# Patient Record
Sex: Male | Born: 1975
Health system: Southern US, Community
[De-identification: ages and names within clinical notes are randomized; demographics above are authoritative.]

## PROBLEM LIST (undated history)

## (undated) DIAGNOSIS — Z87442 Personal history of urinary calculi: Secondary | ICD-10-CM

## (undated) DIAGNOSIS — K603 Anal fistula, unspecified: Secondary | ICD-10-CM

## (undated) DIAGNOSIS — N200 Calculus of kidney: Secondary | ICD-10-CM

## (undated) HISTORY — PX: WISDOM TOOTH EXTRACTION: SHX21

## (undated) HISTORY — DX: Calculus of kidney: N20.0

---

## 2014-09-07 ENCOUNTER — Ambulatory Visit (HOSPITAL_COMMUNITY)
Admission: RE | Admit: 2014-09-07 | Discharge: 2014-09-07 | Disposition: A | Discharge status: Home / Self Care | Payer: 59 | Source: Ambulatory Visit | Attending: Family Medicine | Admitting: Family Medicine

## 2014-09-07 ENCOUNTER — Other Ambulatory Visit (HOSPITAL_COMMUNITY): Payer: Self-pay | Admitting: Family Medicine

## 2014-09-07 DIAGNOSIS — M79601 Pain in right arm: Secondary | ICD-10-CM

## 2014-09-07 NOTE — Progress Notes (Signed)
Right upper extremity venous duplex completed:  No evidence of DVT or superficial thrombosis.    

## 2014-09-28 ENCOUNTER — Other Ambulatory Visit: Payer: Self-pay | Admitting: Orthopedic Surgery

## 2014-09-28 DIAGNOSIS — R591 Generalized enlarged lymph nodes: Secondary | ICD-10-CM

## 2014-10-02 ENCOUNTER — Ambulatory Visit
Admission: RE | Admit: 2014-10-02 | Discharge: 2014-10-02 | Disposition: A | Discharge status: Home / Self Care | Payer: 59 | Source: Ambulatory Visit | Attending: Orthopedic Surgery | Admitting: Orthopedic Surgery

## 2014-10-02 DIAGNOSIS — R591 Generalized enlarged lymph nodes: Secondary | ICD-10-CM

## 2014-11-01 ENCOUNTER — Other Ambulatory Visit (INDEPENDENT_AMBULATORY_CARE_PROVIDER_SITE_OTHER): Payer: 59

## 2014-11-01 ENCOUNTER — Encounter: Payer: Self-pay | Admitting: Family Medicine

## 2014-11-01 ENCOUNTER — Ambulatory Visit (INDEPENDENT_AMBULATORY_CARE_PROVIDER_SITE_OTHER): Payer: 59 | Admitting: Family Medicine

## 2014-11-01 VITALS — BP 120/72 | HR 79 | Ht 67.0 in | Wt 165.0 lb

## 2014-11-01 DIAGNOSIS — M79601 Pain in right arm: Secondary | ICD-10-CM

## 2014-11-01 DIAGNOSIS — M25521 Pain in right elbow: Secondary | ICD-10-CM

## 2014-11-01 NOTE — Progress Notes (Signed)
Jeremiah ScaleZach Baker D.O. [facility] [address] [place], [place] [address] Phone: [phone] Subjective:     CC: Right shoulder pain.   EXB:[id number]:Subjective Jeremiah Baker is a 39 y.o. male coming in with complaint of right arm pain patient states he was lifting on November 2 toe pain immediately on the distal portion of his humerus on the right side medial aspect. Patient states that he this was followed by of redness streaking. Patient 2 days later went to the primary care provider and there was a concern for an upper extremity DVT. Ultrasound was negative. 3 days after this the pain did not resolve and patient went to orthopedics. Patient did have x-ray that did not show any significant findings. Patient was having difficulty actually extending his arm so a CT scan was evaluated. This was reviewed by me. The CT scan did not steroid anything significantly remarkable. Patient does not have a significant amount of the tissue which made identification of certain anatomy difficult. There was no gross deformity. No longer having pain.  Has had 2 episodes of his skin turning red again. Full range of motion.       Past medical history, social, surgical and family history all reviewed in electronic medical record.   Review of Systems: No headache, visual changes, nausea, vomiting, diarrhea, constipation, dizziness, abdominal pain, skin rash, fevers, chills, night sweats, weight loss, swollen lymph nodes, body aches, joint swelling, muscle aches, chest pain, shortness of breath, mood changes.   Objective Blood pressure 120/72, pulse 79, height 5\' 7"  (1.702 m), weight 165 lb (74.844 kg), SpO2 98 %.  General: No apparent distress alert and oriented x3 mood and affect normal, dressed appropriately.  HEENT: Pupils equal, extraocular movements intact  Respiratory: Patient's speak in full sentences and does not appear short of breath  Cardiovascular: No lower extremity edema, non tender,  no erythema  Skin: Warm dry intact with no signs of infection or rash on extremities or on axial skeleton.  Abdomen: Soft nontender  Neuro: Cranial nerves II through XII are intact, neurovascularly intact in all extremities with 2+ DTRs and 2+ pulses.  Lymph: No lymphadenopathy of posterior or anterior cervical chain or axillae bilaterally.  Gait normal with good balance and coordination.  MSK:  Non tender with full range of motion and good stability and symmetric strength and tone of shoulders,  wrist, hip, knee and ankles bilaterally.  Elbow: right  Unremarkable to inspection. Range of motion full pronation, supination, flexion, extension. Strength is full to all of the above directions Stable to varus, valgus stress. Negative moving valgus stress test. No discrete areas of tenderness to palpation. Ulnar nerve does not sublux. Negative cubital tunnel Tinel's. When comparing to the contralateral elbow patient does have a half centimeter increase in diameter. Distal pulses palpated and 2+ compared to the contralateral side. Contralateral elbow unremarkable  Musculoskeletal ultrasound was performed and interpreted by Terrilee FilesZach Baker D.O.   Elbow: Right Lateral epicondyle and common extensor tendon origin visualized.  No edema, effusions, or avulsions seen.  Radial head unremarkable and located in annular ligament Medial epicondyle and common flexor tendon origin visualized.  No edema, effusions, or avulsions seen. Ulnar nerve in cubital tunnel unremarkable. Patient though does have what appears to be a very small part of scar tissue holding down the brachial artery near the medial epicondylar region. Mild hypoechoic changes noted. This is fully compressible with no signs of venous thrombosis. Olecranon and triceps insertion visualized and unremarkable without edema,  effusion, or avulsion.  No signs olecranon bursitis. Power doppler signal normal.  IMPRESSION:  Unremarkable with chronic scarring  surrounding the brachial neurovascular plexus near the medial epicondylar region.      Impression and Recommendations:     This case required medical decision making of moderate complexity.

## 2014-11-01 NOTE — Assessment & Plan Note (Signed)
Patient's right elbow pain seems to be fairly non-specific. Patient is having no pain at this time and has full range of motion. Encourage patient to start doing exercises again. Differential includes the patient may have had actually a deep venous thrombosis of the upper extremity but with him not having any risk factors and no findings at this time I do not feel that treatment is warranted. Discuss that if he gets swelling of the redness again that I would like to see him immediately. Discussed icing and compression sleeve with activity to try to avoid if this is some type of neovascular subluxation that could've occurred with heavy lifting. Patient's biceps tendon as well as Brickell radialis tendon does not show any significant findings that there is no fracture that is noted. Patient will try this conservative therapy and come back in 3 weeks for further evaluation.

## 2014-11-01 NOTE — Patient Instructions (Signed)
Very nice to meet you Get a compression sleeve for the arm.  Dicks or omega sports. Wear it with exercises and 30 minutes afterward.  Ice 20 minutes 2 times daily. Usually after activity and before bed. Start with weight 25% of what you were doing and increase 5-10% a week.   Try the topical medicine up to 2 times daily for pain.  Come back in 3 weeks to make sure you are doing fine.

## 2014-11-26 ENCOUNTER — Ambulatory Visit: Payer: 59 | Admitting: Family Medicine

## 2015-03-14 ENCOUNTER — Encounter: Payer: Self-pay | Admitting: Internal Medicine

## 2015-05-17 ENCOUNTER — Other Ambulatory Visit (INDEPENDENT_AMBULATORY_CARE_PROVIDER_SITE_OTHER): Payer: 59

## 2015-05-17 ENCOUNTER — Ambulatory Visit: Payer: 59 | Admitting: Internal Medicine

## 2015-05-17 ENCOUNTER — Ambulatory Visit (INDEPENDENT_AMBULATORY_CARE_PROVIDER_SITE_OTHER): Payer: 59 | Admitting: Physician Assistant

## 2015-05-17 ENCOUNTER — Encounter: Payer: Self-pay | Admitting: Physician Assistant

## 2015-05-17 VITALS — BP 120/60 | HR 68 | Ht 66.5 in | Wt 160.0 lb

## 2015-05-17 DIAGNOSIS — R1011 Right upper quadrant pain: Secondary | ICD-10-CM

## 2015-05-17 DIAGNOSIS — R1013 Epigastric pain: Secondary | ICD-10-CM | POA: Diagnosis not present

## 2015-05-17 DIAGNOSIS — R110 Nausea: Secondary | ICD-10-CM

## 2015-05-17 LAB — CBC WITH DIFFERENTIAL/PLATELET
Basophils Absolute: 0 10*3/uL (ref 0.0–0.1)
Basophils Relative: 0.3 % (ref 0.0–3.0)
EOS ABS: 0 10*3/uL (ref 0.0–0.7)
Eosinophils Relative: 0.9 % (ref 0.0–5.0)
HCT: 43.6 % (ref 39.0–52.0)
Hemoglobin: 14.4 g/dL (ref 13.0–17.0)
LYMPHS PCT: 38.5 % (ref 12.0–46.0)
Lymphs Abs: 2.1 10*3/uL (ref 0.7–4.0)
MCHC: 33.2 g/dL (ref 30.0–36.0)
MCV: 80.3 fl (ref 78.0–100.0)
Monocytes Absolute: 0.4 10*3/uL (ref 0.1–1.0)
Monocytes Relative: 7.6 % (ref 3.0–12.0)
Neutro Abs: 2.8 10*3/uL (ref 1.4–7.7)
Neutrophils Relative %: 52.7 % (ref 43.0–77.0)
Platelets: 188 10*3/uL (ref 150.0–400.0)
RBC: 5.43 Mil/uL (ref 4.22–5.81)
RDW: 12.9 % (ref 11.5–15.5)
WBC: 5.4 10*3/uL (ref 4.0–10.5)

## 2015-05-17 LAB — HEPATIC FUNCTION PANEL
ALBUMIN: 4.5 g/dL (ref 3.5–5.2)
ALK PHOS: 72 U/L (ref 39–117)
ALT: 31 U/L (ref 0–53)
AST: 21 U/L (ref 0–37)
BILIRUBIN DIRECT: 0.1 mg/dL (ref 0.0–0.3)
TOTAL PROTEIN: 7.3 g/dL (ref 6.0–8.3)
Total Bilirubin: 0.5 mg/dL (ref 0.2–1.2)

## 2015-05-17 LAB — AMMONIA: Ammonia: 28 umol/L (ref 11–35)

## 2015-05-17 LAB — LIPASE: LIPASE: 15 U/L (ref 11.0–59.0)

## 2015-05-17 MED ORDER — PANTOPRAZOLE SODIUM 40 MG PO TBEC: DELAYED_RELEASE_TABLET | ORAL | Status: DC

## 2015-05-17 NOTE — Progress Notes (Signed)
Patient ID: Jeremiah Baker, male   DOB: 1976-08-04, 39 y.o.   MRN: [id number]    HPI:  Jeremiah Baker is a 39 y.o.   male  referred due to complaints of epigastric pain and nausea. Patient is a delightful 39 year old male who states that on Christmas Eve he woke up feeling poorly. He had a sore throat and drank a lot of tea and several days later developed epigastric pain with vomiting. He tried using pepsin tablets for 2 weeks and try to probiotic but had no relief. Since that time, for the past 7 months he frequently feels nauseous and bloated. He is nauseous all the time and it is not affected by food. He has no regurgitation. Its that he gets a bandlike tightness in the epigastric area and right upper quadrant with bloating and nausea. He has no early satiety. His appetite is good. He has not been on any new medications. He denies use of non-steroidal anti-inflammatory drugs. He denies use of alcohol or tobacco. He denies use of spicy foods. He has lost 10 pounds since January but he thinks this may be due to cutting back on carpets. His bowel movements are normal. He's had no bright red blood per rectum or melena. He has an aunt who is a Solicitor in [place]. He has spoken to her on several occasions regarding his discomfort and was advised to seek evaluation. He is concerned about possible gallbladder disease or ulcers.   Past Medical History  Diagnosis Date  . Kidney stones     Past Surgical History  Procedure Laterality Date  . Wisdom tooth extraction     Family History  Problem Relation Age of Onset  . Non-Hodgkin's lymphoma Father   . Hypertension Mother   . Colon polyps Mother    History  Substance Use Topics  . Smoking status: Never Smoker   . Smokeless tobacco: Not on file  . Alcohol Use: No   Current Outpatient Prescriptions  Medication Sig Dispense Refill  . pantoprazole (PROTONIX) 40 MG tablet Take 1 every morning 30 minutes  prior to breakfast. 30 tablet 2   No current facility-administered medications for this visit.   Allergies  Allergen Reactions  . Shellfish Allergy Hives    Reaction to shrimp     Review of Systems: Gen: Denies any fever, chills, sweats, anorexia, fatigue, weakness, malaise, weight loss, and sleep disorder CV: Denies chest pain, angina, palpitations, syncope, orthopnea, PND, peripheral edema, and claudication. Resp: Denies dyspnea at rest, dyspnea with exercise, cough, sputum, wheezing, coughing up blood, and pleurisy. GI: Denies vomiting blood, jaundice, and fecal incontinence.   Denies dysphagia or odynophagia. GU : Denies urinary burning, blood in urine, urinary frequency, urinary hesitancy, nocturnal urination, and urinary incontinence. MS: Denies joint pain, limitation of movement, and swelling, stiffness, low back pain, extremity pain. Denies muscle weakness, cramps, atrophy.  Derm: Denies rash, itching, dry skin, hives, moles, warts, or unhealing ulcers.  Psych: Denies depression, anxiety, memory loss, suicidal ideation, hallucinations, paranoia, and confusion. Heme: Denies bruising, bleeding, and enlarged lymph nodes. Neuro:  Denies any headaches, dizziness, paresthesias. Endo:  Denies any problems with DM, thyroid, adrenal function    Physical Exam: BP 120/60 mmHg  Pulse 68  Ht 5' 6.5" (1.689 m)  Wt 160 lb (72.576 kg)  BMI 25.44 kg/m2 Constitutional: Pleasant,well-developed male in no acute distress. HEENT: Normocephalic and atraumatic. Conjunctivae are normal. No scleral icterus. Neck supple. No thyromegaly Cardiovascular: Normal rate, regular rhythm.  Pulmonary/chest: Effort  normal and breath sounds normal. No wheezing, rales or rhonchi. Abdominal: Soft, nondistended, tender epigastric area and right upper quadrant with no rebound or guarding, Bowel sounds active throughout. There are no masses palpable. No hepatomegaly. Extremities: no edema Lymphadenopathy: No  cervical adenopathy noted. Neurological: Alert and oriented to person place and time. Skin: Skin is warm and dry. No rashes noted. Psychiatric: Normal mood and affect. Behavior is normal.  ASSESSMENT AND PLAN: 39 year old male with a 7 month history of nausea associated with postprandial epigastric and right upper discomfort and bloating self-referred for evaluation. Symptoms may be due to GERD and an antireflux regimen has been reviewed. He will be given a trial of pantoprazole 40 mg 1 by mouth 30 minutes prior to breakfast. A CBC, amylase, lipase, and hepatic function panel will be obtained along with an H. pylori stool antigen. He will be scheduled for an abdominal ultrasound to evaluate for possible choledocholithiasis. He will also be scheduled for an EGD to assess for esophagitis, gastritis, ulcer etc.The risks, benefits, and alternatives to endoscopy with possible biopsy and possible dilation were discussed with the patient and they consent to proceed. The procedure will be scheduled with Dr. Marina Goodell. Further recommendations will be made pending the findings of the above.    Konstantina Nachreiner, Tollie Pizza PA-C 05/17/2015, 4:34 PM

## 2015-05-17 NOTE — Patient Instructions (Addendum)
You have been scheduled for an endoscopy. Please follow written instructions given to you at your visit today. If you use inhalers (even only as needed), please bring them with you on the day of your procedure.  Your physician has requested that you go to the basement for  lab work before leaving today   We have sent medications to your pharmacy for you to pick up at your convenience.  You have been scheduled for an abdominal ultrasound at [facility] (1st floor of hospital) on 05/24/2015 at 10:00am. Please arrive 15 minutes prior to your appointment for registration. Make certain not to have anything to eat or drink after midnight the night before the Ultrasound. Should you need to reschedule your appointment, please contact radiology at [phone]. This test typically takes about 30 minutes to perform.

## 2015-05-18 NOTE — Progress Notes (Signed)
Reviewed. Agree with assessment and plans 

## 2015-05-20 ENCOUNTER — Other Ambulatory Visit: Payer: 59

## 2015-05-20 DIAGNOSIS — R1013 Epigastric pain: Secondary | ICD-10-CM

## 2015-05-20 DIAGNOSIS — R1011 Right upper quadrant pain: Secondary | ICD-10-CM

## 2015-05-20 DIAGNOSIS — R110 Nausea: Secondary | ICD-10-CM

## 2015-05-22 LAB — H. PYLORI ANTIGEN, STOOL: H PYLORI AG STL: NEGATIVE

## 2015-05-23 ENCOUNTER — Other Ambulatory Visit: Payer: Self-pay

## 2015-05-23 DIAGNOSIS — R110 Nausea: Secondary | ICD-10-CM

## 2015-05-23 DIAGNOSIS — R1013 Epigastric pain: Secondary | ICD-10-CM

## 2015-05-23 DIAGNOSIS — R1011 Right upper quadrant pain: Secondary | ICD-10-CM

## 2015-05-24 ENCOUNTER — Ambulatory Visit (HOSPITAL_COMMUNITY)
Admission: RE | Admit: 2015-05-24 | Discharge: 2015-05-24 | Disposition: A | Discharge status: Home / Self Care | Payer: 59 | Source: Ambulatory Visit | Attending: Physician Assistant | Admitting: Physician Assistant

## 2015-05-24 DIAGNOSIS — R1013 Epigastric pain: Secondary | ICD-10-CM

## 2015-05-24 DIAGNOSIS — R1011 Right upper quadrant pain: Secondary | ICD-10-CM | POA: Insufficient documentation

## 2015-05-24 DIAGNOSIS — R110 Nausea: Secondary | ICD-10-CM | POA: Diagnosis not present

## 2015-05-31 ENCOUNTER — Encounter: Payer: Self-pay | Admitting: Internal Medicine

## 2015-05-31 ENCOUNTER — Ambulatory Visit (AMBULATORY_SURGERY_CENTER): Payer: 59 | Admitting: Internal Medicine

## 2015-05-31 VITALS — BP 124/77 | HR 72 | Temp 97.5°F | Resp 25 | Ht 66.5 in | Wt 160.0 lb

## 2015-05-31 DIAGNOSIS — R1011 Right upper quadrant pain: Secondary | ICD-10-CM | POA: Diagnosis not present

## 2015-05-31 MED ORDER — SODIUM CHLORIDE 0.9 % IV SOLN: 500.0000 mL | INTRAVENOUS | Status: DC

## 2015-05-31 NOTE — Op Note (Signed)
[facility] [address] [place], [address]   ENDOSCOPY PROCEDURE REPORT  PATIENT: Jeremiah Baker, Jeremiah Baker  MR#: [id number] BIRTHDATE: Nov 05, 1975 , 39  yrs. old GENDER: male ENDOSCOPIST: Roxy Cedar, MD REFERRED BY:  Darrow Bussing, M.D. PROCEDURE DATE:  05/31/2015 PROCEDURE:  EGD, diagnostic ASA CLASS:     Class I INDICATIONS:  abdominal pain in the upper right quadrant. MEDICATIONS: Monitored anesthesia care and Propofol 200 mg IV TOPICAL ANESTHETIC: none  DESCRIPTION OF PROCEDURE: After the risks benefits and alternatives of the procedure were thoroughly explained, informed consent was obtained.  The LB [id number] endoscope was introduced through the mouth and advanced to the second portion of the duodenum , Without limitations.  The instrument was slowly withdrawn as the mucosa was fully examined.   EXAM: The esophagus and gastroesophageal junction were completely normal in appearance.  The stomach was entered and closely examined.The antrum, angularis, and lesser curvature were well visualized, including a retroflexed view of the cardia and fundus. The stomach wall was normally distensable.  The scope passed easily through the pylorus into the duodenum.  Retroflexed views revealed no abnormalities.     The scope was then withdrawn from the patient and the procedure completed.  COMPLICATIONS: There were no immediate complications.  ENDOSCOPIC IMPRESSION: 1. Normal upper endoscopy 2. Upper abdominal discomfort may be due to gallbladder disease (sludge/?small stones on ultrasound)  RECOMMENDATIONS: 1.  Discontinue pantoprazole 2.  General surgical opinion regarding abdominal complaints and ultrasound findings. Question possible laparoscopic cholecystectomy. My office will arrange surgical consultation.  REPEAT EXAM:  eSigned:  Roxy Cedar, MD 05/31/2015 3:17 PM    CC:The Patient  ; Darrow Bussing, MD

## 2015-05-31 NOTE — Patient Instructions (Addendum)
YOU HAD AN ENDOSCOPIC PROCEDURE TODAY AT THE [facility]:   Refer to the procedure report that was given to you for any specific questions about what was found during the examination.  If the procedure report does not answer your questions, please call your gastroenterologist to clarify.  If you requested that your care partner not be given the details of your procedure findings, then the procedure report has been included in a sealed envelope for you to review at your convenience later.  YOU SHOULD EXPECT: Some feelings of bloating in the abdomen. Passage of more gas than usual.  Walking can help get rid of the air that was put into your GI tract during the procedure and reduce the bloating. If you had a lower endoscopy (such as a colonoscopy or flexible sigmoidoscopy) you may notice spotting of blood in your stool or on the toilet paper. If you underwent a bowel prep for your procedure, you may not have a normal bowel movement for a few days.  Please Note:  You might notice some irritation and congestion in your nose or some drainage.  This is from the oxygen used during your procedure.  There is no need for concern and it should clear up in a day or so.  SYMPTOMS TO REPORT IMMEDIATELY:   Following lower endoscopy (colonoscopy or flexible sigmoidoscopy):  Excessive amounts of blood in the stool  Significant tenderness or worsening of abdominal pains  Swelling of the abdomen that is new, acute  Fever of 100F or higher    For urgent or emergent issues, a gastroenterologist can be reached at any hour by calling [phone].   DIET: Your first meal following the procedure should be a small meal and then it is ok to progress to your normal diet. Heavy or fried foods are harder to digest and may make you feel nauseous or bloated.  Likewise, meals heavy in dairy and vegetables can increase bloating.  Drink plenty of fluids but you should avoid alcoholic beverages for 24  hours.  ACTIVITY:  You should plan to take it easy for the rest of today and you should NOT DRIVE or use heavy machinery until tomorrow (because of the sedation medicines used during the test).    FOLLOW UP: Our staff will call the number listed on your records the next business day following your procedure to check on you and address any questions or concerns that you may have regarding the information given to you following your procedure. If we do not reach you, we will leave a message.  However, if you are feeling well and you are not experiencing any problems, there is no need to return our call.  We will assume that you have returned to your regular daily activities without incident.  If any biopsies were taken you will be contacted by phone or by letter within the next 1-3 weeks.  Please call us at [phone] if you have not heard about the biopsies in 3 weeks.    SIGNATURES/CONFIDENTIALITY: You and/or your care partner have signed paperwork which will be entered into your electronic medical record.  These signatures attest to the fact that that the information above on your After Visit Summary has been reviewed and is understood.  Full responsibility of the confidentiality of this discharge information lies with you and/or your care-partner.  Discontinue panprotazole. Dr. Lamar Sprinkles office will arrange for a surgical consult WG:[id number] surgery.YOU HAD AN ENDOSCOPIC PROCEDURE TODAY AT THE [facility]:  Refer to the procedure report that was given to you for any specific questions about what was found during the examination.  If the procedure report does not answer your questions, please call your gastroenterologist to clarify.  If you requested that your care partner not be given the details of your procedure findings, then the procedure report has been included in a sealed envelope for you to review at your convenience later.  YOU SHOULD EXPECT: Some feelings of bloating in  the abdomen. Passage of more gas than usual.  Walking can help get rid of the air that was put into your GI tract during the procedure and reduce the bloating. If you had a lower endoscopy (such as a colonoscopy or flexible sigmoidoscopy) you may notice spotting of blood in your stool or on the toilet paper. If you underwent a bowel prep for your procedure, you may not have a normal bowel movement for a few days.  Please Note:  You might notice some irritation and congestion in your nose or some drainage.  This is from the oxygen used during your procedure.  There is no need for concern and it should clear up in a day or so.  SYMPTOMS TO REPORT IMMEDIATELY:     Following upper endoscopy (EGD)  Vomiting of blood or coffee ground material  New chest pain or pain under the shoulder blades  Painful or persistently difficult swallowing  New shortness of breath  Fever of 100F or higher  Black, tarry-looking stools  For urgent or emergent issues, a gastroenterologist can be reached at any hour by calling [phone].   DIET: Your first meal following the procedure should be a small meal and then it is ok to progress to your normal diet. Heavy or fried foods are harder to digest and may make you feel nauseous or bloated.  Likewise, meals heavy in dairy and vegetables can increase bloating.  Drink plenty of fluids but you should avoid alcoholic beverages for 24 hours.  ACTIVITY:  You should plan to take it easy for the rest of today and you should NOT DRIVE or use heavy machinery until tomorrow (because of the sedation medicines used during the test).    FOLLOW UP: Our staff will call the number listed on your records the next business day following your procedure to check on you and address any questions or concerns that you may have regarding the information given to you following your procedure. If we do not reach you, we will leave a message.  However, if you are feeling well and you are not  experiencing any problems, there is no need to return our call.  We will assume that you have returned to your regular daily activities without incident.  If any biopsies were taken you will be contacted by phone or by letter within the next 1-3 weeks.  Please call us at [phone] if you have not heard about the biopsies in 3 weeks.    SIGNATURES/CONFIDENTIALITY: You and/or your care partner have signed paperwork which will be entered into your electronic medical record.  These signatures attest to the fact that that the information above on your After Visit Summary has been reviewed and is understood.  Full responsibility of the confidentiality of this discharge information lies with you and/or your care-partner.

## 2015-05-31 NOTE — Progress Notes (Signed)
To recovery, report to Scott, RN, VSS 

## 2015-06-03 ENCOUNTER — Telehealth: Payer: Self-pay

## 2015-06-03 NOTE — Telephone Encounter (Signed)
Pt scheduled to see Dr. Michaell Cowing for possible gallbladder surgery with [facility] 06/19/15@8 :45am, pt to arrive there at 8:15am. Pt aware of appt.

## 2015-06-03 NOTE — Telephone Encounter (Signed)
  Follow up Call-  Call back number 05/31/2015  Post procedure Call Back phone  # [phone] (M)  Permission to leave phone message Yes     Patient questions:  Do you have a fever, pain , or abdominal swelling? No. Pain Score  0 *  Have you tolerated food without any problems? Yes.    Have you been able to return to your normal activities? Yes.    Do you have any questions about your discharge instructions: Diet   No. Medications  No. Follow up visit  No.  Do you have questions or concerns about your Care? No.  Actions: * If pain score is 4 or above: No action needed, pain <4.

## 2015-06-19 ENCOUNTER — Other Ambulatory Visit: Payer: Self-pay | Admitting: Surgery

## 2015-06-19 NOTE — H&P (Signed)
Jeremiah Baker 06/19/2015 8:36 AM Location: [facility] Patient #: [id number] DOB: Aug 21, 1976 Married / Language: [name] / Race: White Male History of Present Illness Ardeth Sportsman MD; 06/19/2015 1:41 PM) Patient words: gallbladder.  The patient is a 39 year old male who presents for evaluation of gall stones. Patient sent for surgical consultation by his gastroenterologist Dr. Yancey Flemings with [facility]. Concern for upper abdominal pain and gallbladder sludge/stones. Young active male. History of intermittent epigastric pain starting last Christmas Eve. Bloating, nausea/vomiting after eating Tiramisu. Again on New Years Eve. Both episodes happened after eating a rich dessert and after having some salmon. Remembers getting amoxillin for sore throat - no more GI Sx for a few weeks. However, intermittent recurring episodes of nausea and bloating. Usually worse in the RIGHT upper quadrant. Discussed with his Aunt whom is a gastroenterologist. Saw his PCP. Concern perhaps it was related to reflux but no improvement on proton pump inhibitors. Saw GI, and upper endoscopy earlier this month appeared normal. Given ultrasound showed evidence of gallstones/sludge, surgical consultation requested to see if gallbladder as possible etiology and that this is biliary colic. Eating less, losing weight (15lbs over past year). He works out at [employer] 4 times a week. Used to take moderate volume of nutritional supplements. When diagnosed with gallstones backed off to just mild intermittent protein supplement. He claims he can eat large meals without much difficulty. Usually has about one every day. He gets attacks about twice a week now. Not particularly more intense. No increased belching or reflux. No problems with reflux or heartburn while supine or bending over. No sick contacts. No travel history. Originally much of his family is from [place] but no recent travels. He had been  avoiding coffee and tomato and acid foods without much improvement. He rarely drinks alcohol. Only intermittent use of nonsteroidals. Certainly not on a weekly let alone daily. No history of hepatitis or pancreatitis. No history of jaundice. Does not smoke. No prior abdominal surgery. No history of Crohn's. No ulcerative colitis. No irritable bowel syndrome. No inflammatory bowel disease. Other Problems Fay Records, CMA; 06/19/2015 8:36 AM) Kidney Larina Bras  Past Surgical History Fay Records, [place]; 06/19/2015 8:36 AM) No pertinent past surgical history  Diagnostic Studies History Fay Records, [place]; 06/19/2015 8:36 AM) Colonoscopy never  Allergies Fay Records, CMA; 06/19/2015 8:36 AM) Shellfish Hives.  Medication History Fay Records, CMA; 06/19/2015 8:37 AM) Pantoprazole Sodium (40MG  Tablet DR, Oral daily) Active. Medications Reconciled  Social History Fay Records, [place]; 06/19/2015 8:36 AM) Caffeine use Tea. No alcohol use No drug use Tobacco use Never smoker.  Family History Fay Records, [place]; 06/19/2015 8:36 AM) Cancer Father. Hypertension Mother.     Review of Systems Fay Records CMA; 06/19/2015 8:36 AM) General Present- Appetite Loss and Weight Loss. Not Present- Chills, Fatigue, Fever, Night Sweats and Weight Gain. Skin Not Present- Change in Wart/Mole, Dryness, Hives, Jaundice, New Lesions, Non-Healing Wounds, Rash and Ulcer. HEENT Present- Seasonal Allergies. Not Present- Earache, Hearing Loss, Hoarseness, Nose Bleed, Oral Ulcers, Ringing in the Ears, Sinus Pain, Sore Throat, Visual Disturbances, Wears glasses/contact lenses and Yellow Eyes. Respiratory Not Present- Bloody sputum, Chronic Cough, Difficulty Breathing, Snoring and Wheezing. Breast Not Present- Breast Mass, Breast Pain, Nipple Discharge and Skin Changes. Cardiovascular Not Present- Chest Pain, Difficulty Breathing Lying Down, Leg Cramps, Palpitations, Rapid Heart Rate, Shortness of Breath and  Swelling of Extremities. Gastrointestinal Present- Bloating and Nausea. Not Present- Abdominal Pain, Bloody Stool, Change in Bowel Habits, Chronic  diarrhea, Constipation, Difficulty Swallowing, Excessive gas, Gets full quickly at meals, Hemorrhoids, Indigestion, Rectal Pain and Vomiting. Male Genitourinary Not Present- Blood in Urine, Change in Urinary Stream, Frequency, Impotence, Nocturia, Painful Urination, Urgency and Urine Leakage. Musculoskeletal Not Present- Back Pain, Joint Pain, Joint Stiffness, Muscle Pain, Muscle Weakness and Swelling of Extremities. Neurological Not Present- Decreased Memory, Fainting, Headaches, Numbness, Seizures, Tingling, Tremor, Trouble walking and Weakness. Psychiatric Not Present- Anxiety, Bipolar, Change in Sleep Pattern, Depression, Fearful and Frequent crying. Endocrine Not Present- Cold Intolerance, Excessive Hunger, Hair Changes, Heat Intolerance, Hot flashes and New Diabetes. Hematology Not Present- Easy Bruising, Excessive bleeding, Gland problems, HIV and Persistent Infections.  Vitals Fay Records CMA; 06/19/2015 8:37 AM) 06/19/2015 8:37 AM Weight: 156 lb Height: 67in Body Surface Area: 1.83 m Body Mass Index: 24.43 kg/m Temp.: 97.63F(Oral)  Pulse: 85 (Regular)  BP: 128/78 (Sitting, Left Arm, Standard)     Physical Exam Ardeth Sportsman MD; 06/19/2015 1:40 PM)  General Mental Status-Alert. General Appearance-Not in acute distress, Not Sickly. Orientation-Oriented X3. Hydration-Well hydrated. Voice-Normal. Note: Pleasant. Talkative.   Integumentary Global Assessment Upon inspection and palpation of skin surfaces of the - Axillae: non-tender, no inflammation or ulceration, no drainage. and Distribution of scalp and body hair is normal. General Characteristics Temperature - normal warmth is noted.  Head and Neck Head-normocephalic, atraumatic with no lesions or palpable masses. Face Global Assessment -  atraumatic, no absence of expression. Neck Global Assessment - no abnormal movements, no bruit auscultated on the right, no bruit auscultated on the left, no decreased range of motion, non-tender. Trachea-midline. Thyroid Gland Characteristics - non-tender.  Eye Eyeball - Left-Extraocular movements intact, No Nystagmus. Eyeball - Right-Extraocular movements intact, No Nystagmus. Cornea - Left-No Hazy. Cornea - Right-No Hazy. Sclera/Conjunctiva - Left-No scleral icterus, No Discharge. Sclera/Conjunctiva - Right-No scleral icterus, No Discharge. Pupil - Left-Direct reaction to light normal. Pupil - Right-Direct reaction to light normal.  ENMT Ears Pinna - Left - no drainage observed, no generalized tenderness observed. Right - no drainage observed, no generalized tenderness observed. Nose and Sinuses External Inspection of the Nose - no destructive lesion observed. Inspection of the nares - Left - quiet respiration. Right - quiet respiration. Mouth and Throat Lips - Upper Lip - no fissures observed, no pallor noted. Lower Lip - no fissures observed, no pallor noted. Nasopharynx - no discharge present. Oral Cavity/Oropharynx - Tongue - no dryness observed. Oral Mucosa - no cyanosis observed. Hypopharynx - no evidence of airway distress observed.  Chest and Lung Exam Inspection Movements - Normal and Symmetrical. Accessory muscles - No use of accessory muscles in breathing. Palpation Palpation of the chest reveals - Non-tender. Auscultation Breath sounds - Normal and Clear.  Cardiovascular Auscultation Rhythm - Regular. Murmurs & Other Heart Sounds - Auscultation of the heart reveals - No Murmurs and No Systolic Clicks.  Abdomen Inspection Inspection of the abdomen reveals - No Visible peristalsis and No Abnormal pulsations. Umbilicus - No Bleeding, No Urine drainage. Palpation/Percussion Palpation and Percussion of the abdomen reveal - Soft, Non Tender, No  Rebound tenderness, No Rigidity (guarding) and No Cutaneous hyperesthesia. Note: Soft and flat. No diastases. No guarding. No Murphy sign. No umbilical hernia.   Male Genitourinary Sexual Maturity Tanner 5 - Adult hair pattern and Adult penile size and shape. Note: No inguinal hernias. Normal external genitalia.   Peripheral Vascular Upper Extremity Inspection - Left - No Cyanotic nailbeds, Not Ischemic. Right - No Cyanotic nailbeds, Not Ischemic.  Neurologic Neurologic evaluation reveals -normal  attention span and ability to concentrate, able to name objects and repeat phrases. Appropriate fund of knowledge , normal sensation and normal coordination. Mental Status Affect - not angry, not paranoid. Cranial Nerves-Normal Bilaterally. Gait-Normal.  Neuropsychiatric Mental status exam performed with findings of-able to articulate well with normal speech/language, rate, volume and coherence, thought content normal with ability to perform basic computations and apply abstract reasoning and no evidence of hallucinations, delusions, obsessions or homicidal/suicidal ideation.  Musculoskeletal Global Assessment Spine, Ribs and Pelvis - no instability, subluxation or laxity. Right Upper Extremity - no instability, subluxation or laxity.  Lymphatic Head & Neck  General Head & Neck Lymphatics: Bilateral - Description - No Localized lymphadenopathy. Axillary  General Axillary Region: Bilateral - Description - No Localized lymphadenopathy. Femoral & Inguinal  Generalized Femoral & Inguinal Lymphatics: Left - Description - No Localized lymphadenopathy. Right - Description - No Localized lymphadenopathy.    Assessment & Plan Ardeth Sportsman MD; 06/19/2015 1:40 PM)  SYMPTOMATIC CHOLELITHIASIS (574.20  K80.20) Impression: His history of postprandial nausea and bloating and upper abdominal especially RIGHT upper quadrant pain is suspicious for biliary colic. Negative  gastroenterology workup and lack of improvement on an antacid regimen helps rule out out. Suspicion of other digestive tract her cardiopulmonary issues very low as well. I think he would benefit from cholecystectomy. Reasonable single site approach.  Another possibility is perhaps delayed gastric emptying been in a healthy nondiabetic male without chronic narcotics or other complicated history, I'm skeptical that that is the etiology. No retained food on EGD. He does not have severe belching/heartburn/constipation.  He seems more convinced that it is his gallbladder after talking with him for 45 minutes. He wants to think about things and will call after he discussed with his aunt who is a gastroenterologist & rest of his family.  Current Plans You are being scheduled for surgery - Our schedulers will call you.  You should hear from our office's scheduling department within 5 working days about the location, date, and time of surgery. We try to make accommodations for patient's preferences in scheduling surgery, but sometimes the OR schedule or the surgeon's schedule prevents [place] from making those accommodations.  If you have not heard from our office [phone]) in 5 working days, call the office and ask for your surgeon's nurse.  If you have other questions about your diagnosis, plan, or surgery, call the office and ask for your surgeon's nurse. Pt Education - Pamphlet Given - Laparoscopic Gallbladder Surgery: discussed with patient and provided information.   The anatomy & physiology of hepatobiliary & pancreatic function was discussed. The pathophysiology of gallbladder dysfunction was discussed. Natural history risks without surgery was discussed. I feel the risks of no intervention will lead to serious problems that outweigh the operative risks; therefore, I recommended cholecystectomy to remove the pathology. I explained laparoscopic techniques with possible need for an open approach.  Probable cholangiogram to evaluate the bilary tract was explained as well.  Risks such as bleeding, infection, abscess, leak, injury to other organs, need for further treatment, heart attack, death, and other risks were discussed. I noted a good likelihood this will help address the problem. Possibility that this will not correct all abdominal symptoms was explained. Goals of post-operative recovery were discussed as well. We will work to minimize complications. An educational handout further explaining the pathology and treatment options was given as well. Questions were answered. The patient expresses understanding & wishes to proceed with surgery. Pt Education - [facility] Laparosopic  Post Op HCI (Harland Aguiniga) Pt Education - [facility] Good Bowel Health (Feliciana Narayan) Pt Education - Laparoscopic Cholecystectomy: gallbladder  Ardeth Sportsman, M.D., F.A.C.S. Gastrointestinal and Minimally Invasive Surgery [facility] [address], [address] [place], [place] [address] [phone] Main / Paging

## 2015-07-16 NOTE — Patient Instructions (Addendum)
Jeremiah Baker [facility]  07/16/2015   Your procedure is scheduled on:   07/25/2015    Report to [facility] Main  Entrance take [facility]  elevators to 3rd floor to  Short Stay Center at     1230pm  Call this number if you have problems the morning of surgery [phone]   Remember: ONLY 1 PERSON MAY GO WITH YOU TO SHORT STAY TO GET  READY MORNING OF YOUR SURGERY.  Do not eat food after midnite.  May have clear liquids from 12 midnite until 0800am morning of surgery then nothing by mouth.       Take these medicines the morning of surgery with A SIP OF WATER: none                                 You may not have any metal on your body including hair pins and              piercings  Do not wear jewelry,  lotions, powders or perfumes, deodorant                        Men may shave face and neck.   Do not bring valuables to the hospital. [place] IS NOT             RESPONSIBLE   FOR VALUABLES.  Contacts, dentures or bridgework may not be worn into surgery.       Patients discharged the day of surgery will not be allowed to drive home.  Name and phone number of your driver:  Special Instructions: coughing and deep breathing exercises , leg exercises               Please read over the following fact sheets you were given: _____________________________________________________________________             [facility] - Preparing for Surgery Before surgery, you can play an important role.  Because skin is not sterile, your skin needs to be as free of germs as possible.  You can reduce the number of germs on your skin by washing with CHG (chlorahexidine gluconate) soap before surgery.  CHG is an antiseptic cleaner which kills germs and bonds with the skin to continue killing germs even after washing. Please DO NOT use if you have an allergy to CHG or antibacterial soaps.  If your skin becomes reddened/irritated stop using the CHG and inform your nurse when you  arrive at Short Stay. Do not shave (including legs and underarms) for at least 48 hours prior to the first CHG shower.  You may shave your face/neck. Please follow these instructions carefully:  1.  Shower with CHG Soap the night before surgery and the  morning of Surgery.  2.  If you choose to wash your hair, wash your hair first as usual with your  normal  shampoo.  3.  After you shampoo, rinse your hair and body thoroughly to remove the  shampoo.                           4.  Use CHG as you would any other liquid soap.  You can apply chg directly  to the skin and wash  Gently with a scrungie or clean washcloth.  5.  Apply the CHG Soap to your body ONLY FROM THE NECK DOWN.   Do not use on face/ open                           Wound or open sores. Avoid contact with eyes, ears mouth and genitals (private parts).                       Wash face,  Genitals (private parts) with your normal soap.             6.  Wash thoroughly, paying special attention to the area where your surgery  will be performed.  7.  Thoroughly rinse your body with warm water from the neck down.  8.  DO NOT shower/wash with your normal soap after using and rinsing off  the CHG Soap.                9.  Pat yourself dry with a clean towel.            10.  Wear clean pajamas.            11.  Place clean sheets on your bed the night of your first shower and do not  sleep with pets. Day of Surgery : Do not apply any lotions/deodorants the morning of surgery.  Please wear clean clothes to the hospital/surgery center.  FAILURE TO FOLLOW THESE INSTRUCTIONS MAY RESULT IN THE CANCELLATION OF YOUR SURGERY PATIENT SIGNATURE_________________________________  NURSE SIGNATURE__________________________________  ________________________________________________________________________    CLEAR LIQUID DIET   Foods Allowed                                                                     Foods Excluded  Coffee  and tea, regular and decaf                             liquids that you cannot  Plain Jell-O in any flavor                                             see through such as: Fruit ices (not with fruit pulp)                                     milk, soups, orange juice  Iced Popsicles                                    All solid food Carbonated beverages, regular and diet                                    Cranberry, grape and apple juices Sports drinks like Gatorade Lightly seasoned clear broth or consume(fat free) Sugar, honey syrup  Sample Menu Breakfast                                Lunch                                     Supper Cranberry juice                    Beef broth                            Chicken broth Jell-O                                     Grape juice                           Apple juice Coffee or tea                        Jell-O                                      Popsicle                                                Coffee or tea                        Coffee or tea  _____________________________________________________________________

## 2015-07-18 ENCOUNTER — Encounter (HOSPITAL_COMMUNITY)
Admission: RE | Admit: 2015-07-18 | Discharge: 2015-07-18 | Disposition: A | Discharge status: Home / Self Care | Payer: 59 | Source: Ambulatory Visit | Attending: Surgery | Admitting: Surgery

## 2015-07-18 ENCOUNTER — Encounter (HOSPITAL_COMMUNITY): Payer: Self-pay

## 2015-07-18 DIAGNOSIS — Z01818 Encounter for other preprocedural examination: Secondary | ICD-10-CM | POA: Diagnosis not present

## 2015-07-18 DIAGNOSIS — K8050 Calculus of bile duct without cholangitis or cholecystitis without obstruction: Secondary | ICD-10-CM | POA: Insufficient documentation

## 2015-07-18 LAB — CBC
HCT: 45.3 % (ref 39.0–52.0)
Hemoglobin: 14.8 g/dL (ref 13.0–17.0)
MCH: 27.3 pg (ref 26.0–34.0)
MCHC: 32.7 g/dL (ref 30.0–36.0)
MCV: 83.6 fL (ref 78.0–100.0)
Platelets: 164 K/uL (ref 150–400)
RBC: 5.42 MIL/uL (ref 4.22–5.81)
RDW: 12.4 % (ref 11.5–15.5)
WBC: 4.8 K/uL (ref 4.0–10.5)

## 2015-07-25 ENCOUNTER — Ambulatory Visit (HOSPITAL_COMMUNITY): Payer: 59 | Admitting: Certified Registered Nurse Anesthetist

## 2015-07-25 ENCOUNTER — Encounter (HOSPITAL_COMMUNITY): Payer: Self-pay | Admitting: *Deleted

## 2015-07-25 ENCOUNTER — Ambulatory Visit (HOSPITAL_COMMUNITY)
Admission: RE | Admit: 2015-07-25 | Discharge: 2015-07-25 | Disposition: A | Discharge status: Home / Self Care | Payer: 59 | Source: Ambulatory Visit | Attending: Surgery | Admitting: Surgery

## 2015-07-25 ENCOUNTER — Ambulatory Visit (HOSPITAL_COMMUNITY): Payer: 59

## 2015-07-25 ENCOUNTER — Encounter (HOSPITAL_COMMUNITY)
Admission: RE | Disposition: A | Discharge status: Home / Self Care | Payer: Self-pay | Source: Ambulatory Visit | Attending: Surgery

## 2015-07-25 DIAGNOSIS — K8010 Calculus of gallbladder with chronic cholecystitis without obstruction: Secondary | ICD-10-CM

## 2015-07-25 DIAGNOSIS — K811 Chronic cholecystitis: Secondary | ICD-10-CM | POA: Diagnosis not present

## 2015-07-25 DIAGNOSIS — K219 Gastro-esophageal reflux disease without esophagitis: Secondary | ICD-10-CM | POA: Diagnosis not present

## 2015-07-25 DIAGNOSIS — K8020 Calculus of gallbladder without cholecystitis without obstruction: Secondary | ICD-10-CM

## 2015-07-25 DIAGNOSIS — R100 Acute abdomen: Secondary | ICD-10-CM

## 2015-07-25 HISTORY — PX: LAPAROSCOPIC CHOLECYSTECTOMY SINGLE SITE WITH INTRAOPERATIVE CHOLANGIOGRAM: SHX6538

## 2015-07-25 SURGERY — LAPAROSCOPIC CHOLECYSTECTOMY SINGLE SITE WITH INTRAOPERATIVE CHOLANGIOGRAM
Anesthesia: General | Site: Abdomen

## 2015-07-25 MED ORDER — KETOROLAC TROMETHAMINE 30 MG/ML IJ SOLN
INTRAMUSCULAR | Status: DC | PRN
  Administered 2015-07-25: 30 mg via INTRAVENOUS

## 2015-07-25 MED ORDER — FENTANYL CITRATE (PF) 100 MCG/2ML IJ SOLN
INTRAMUSCULAR | Status: DC | PRN
  Administered 2015-07-25 (×5): 50 ug via INTRAVENOUS

## 2015-07-25 MED ORDER — MEPERIDINE HCL 50 MG/ML IJ SOLN: 6.2500 mg | INTRAMUSCULAR | Status: DC | PRN

## 2015-07-25 MED ORDER — BUPIVACAINE-EPINEPHRINE 0.25% -1:200000 IJ SOLN
INTRAMUSCULAR | Status: AC
  Filled 2015-07-25: qty 1

## 2015-07-25 MED ORDER — SUCCINYLCHOLINE CHLORIDE 20 MG/ML IJ SOLN
INTRAMUSCULAR | Status: DC | PRN
  Administered 2015-07-25: 100 mg via INTRAVENOUS

## 2015-07-25 MED ORDER — MIDAZOLAM HCL 2 MG/2ML IJ SOLN
INTRAMUSCULAR | Status: AC
  Filled 2015-07-25: qty 2

## 2015-07-25 MED ORDER — OXYCODONE HCL 5 MG PO TABS: 5.0000 mg | ORAL_TABLET | ORAL | Status: DC | PRN

## 2015-07-25 MED ORDER — ROCURONIUM BROMIDE 100 MG/10ML IV SOLN
INTRAVENOUS | Status: DC | PRN
  Administered 2015-07-25: 10 mg via INTRAVENOUS
  Administered 2015-07-25: 40 mg via INTRAVENOUS

## 2015-07-25 MED ORDER — PROMETHAZINE HCL 25 MG/ML IJ SOLN: 6.2500 mg | INTRAMUSCULAR | Status: DC | PRN

## 2015-07-25 MED ORDER — LACTATED RINGERS IV SOLN: INTRAVENOUS | Status: DC

## 2015-07-25 MED ORDER — CHLORHEXIDINE GLUCONATE 4 % EX LIQD: 1.0000 "application " | Freq: Once | CUTANEOUS | Status: DC

## 2015-07-25 MED ORDER — IOHEXOL 300 MG/ML  SOLN
INTRAMUSCULAR | Status: DC | PRN
  Administered 2015-07-25: 3 mL

## 2015-07-25 MED ORDER — GLYCOPYRROLATE 0.2 MG/ML IJ SOLN
INTRAMUSCULAR | Status: DC | PRN
  Administered 2015-07-25: 0.2 mg via INTRAVENOUS

## 2015-07-25 MED ORDER — HYDROMORPHONE HCL 1 MG/ML IJ SOLN
INTRAMUSCULAR | Status: AC
  Filled 2015-07-25: qty 1

## 2015-07-25 MED ORDER — BUPIVACAINE-EPINEPHRINE 0.25% -1:200000 IJ SOLN
INTRAMUSCULAR | Status: DC | PRN
  Administered 2015-07-25: 80 mL

## 2015-07-25 MED ORDER — PROPOFOL 10 MG/ML IV BOLUS
INTRAVENOUS | Status: AC
  Filled 2015-07-25: qty 20

## 2015-07-25 MED ORDER — PROPOFOL 10 MG/ML IV BOLUS
INTRAVENOUS | Status: DC | PRN
  Administered 2015-07-25: 200 mg via INTRAVENOUS

## 2015-07-25 MED ORDER — FENTANYL CITRATE (PF) 250 MCG/5ML IJ SOLN
INTRAMUSCULAR | Status: AC
  Filled 2015-07-25: qty 25

## 2015-07-25 MED ORDER — LACTATED RINGERS IV SOLN
INTRAVENOUS | Status: DC
  Administered 2015-07-25: 16:00:00 via INTRAVENOUS
  Administered 2015-07-25: 1000 mL via INTRAVENOUS

## 2015-07-25 MED ORDER — SUGAMMADEX SODIUM 500 MG/5ML IV SOLN
INTRAVENOUS | Status: AC
Start: 2015-07-25 — End: 2015-07-25
  Filled 2015-07-25: qty 5

## 2015-07-25 MED ORDER — LIDOCAINE HCL (CARDIAC) 20 MG/ML IV SOLN
INTRAVENOUS | Status: DC | PRN
  Administered 2015-07-25: 100 mg via INTRAVENOUS

## 2015-07-25 MED ORDER — KETOROLAC TROMETHAMINE 30 MG/ML IJ SOLN
INTRAMUSCULAR | Status: AC
  Filled 2015-07-25: qty 2

## 2015-07-25 MED ORDER — PROMETHAZINE HCL 12.5 MG PO TABS: 12.5000 mg | ORAL_TABLET | Freq: Four times a day (QID) | ORAL | Status: DC | PRN

## 2015-07-25 MED ORDER — MIDAZOLAM HCL 5 MG/5ML IJ SOLN
INTRAMUSCULAR | Status: DC | PRN
  Administered 2015-07-25: 2 mg via INTRAVENOUS

## 2015-07-25 MED ORDER — BUPIVACAINE-EPINEPHRINE (PF) 0.25% -1:200000 IJ SOLN
INTRAMUSCULAR | Status: AC
  Filled 2015-07-25: qty 30

## 2015-07-25 MED ORDER — SUGAMMADEX SODIUM 500 MG/5ML IV SOLN
INTRAVENOUS | Status: DC | PRN
  Administered 2015-07-25: 400 mg via INTRAVENOUS

## 2015-07-25 MED ORDER — ONDANSETRON HCL 4 MG/2ML IJ SOLN
INTRAMUSCULAR | Status: DC | PRN
  Administered 2015-07-25: 4 mg via INTRAVENOUS

## 2015-07-25 MED ORDER — STERILE WATER FOR IRRIGATION IR SOLN
Status: DC | PRN
  Administered 2015-07-25: 1000 mL

## 2015-07-25 MED ORDER — ROCURONIUM BROMIDE 100 MG/10ML IV SOLN
INTRAVENOUS | Status: AC
  Filled 2015-07-25: qty 1

## 2015-07-25 MED ORDER — DEXAMETHASONE SODIUM PHOSPHATE 10 MG/ML IJ SOLN
INTRAMUSCULAR | Status: AC
  Filled 2015-07-25: qty 1

## 2015-07-25 MED ORDER — LIDOCAINE HCL (CARDIAC) 20 MG/ML IV SOLN
INTRAVENOUS | Status: AC
  Filled 2015-07-25: qty 5

## 2015-07-25 MED ORDER — DEXAMETHASONE SODIUM PHOSPHATE 10 MG/ML IJ SOLN
INTRAMUSCULAR | Status: DC | PRN
  Administered 2015-07-25: 10 mg via INTRAVENOUS

## 2015-07-25 MED ORDER — GLYCOPYRROLATE 0.2 MG/ML IJ SOLN
INTRAMUSCULAR | Status: AC
  Filled 2015-07-25: qty 3

## 2015-07-25 MED ORDER — HYDROMORPHONE HCL 1 MG/ML IJ SOLN
0.2500 mg | INTRAMUSCULAR | Status: DC | PRN
  Administered 2015-07-25 (×2): 0.5 mg via INTRAVENOUS

## 2015-07-25 MED ORDER — ONDANSETRON HCL 4 MG/2ML IJ SOLN
INTRAMUSCULAR | Status: AC
  Filled 2015-07-25: qty 2

## 2015-07-25 MED ORDER — LACTATED RINGERS IR SOLN
Status: DC | PRN
  Administered 2015-07-25: 3000 mL

## 2015-07-25 MED ORDER — 0.9 % SODIUM CHLORIDE (POUR BTL) OPTIME
TOPICAL | Status: DC | PRN
  Administered 2015-07-25: 1000 mL

## 2015-07-25 SURGICAL SUPPLY — 39 items
APPLIER CLIP 5 13 M/L LIGAMAX5 (MISCELLANEOUS) ×3
APR CLP MED LRG 5 ANG JAW (MISCELLANEOUS) ×1
BAG SPEC RTRVL LRG 6X4 10 (ENDOMECHANICALS)
CABLE HIGH FREQUENCY MONO STRZ (ELECTRODE) ×3 IMPLANT
CHLORAPREP W/TINT 26ML (MISCELLANEOUS) ×3 IMPLANT
CLIP APPLIE 5 13 M/L LIGAMAX5 (MISCELLANEOUS) ×1 IMPLANT
COVER MAYO STAND STRL (DRAPES) ×5 IMPLANT
COVER SURGICAL LIGHT HANDLE (MISCELLANEOUS) ×3 IMPLANT
DECANTER SPIKE VIAL GLASS SM (MISCELLANEOUS) ×5 IMPLANT
DRAIN CHANNEL 19F RND (DRAIN) IMPLANT
DRAPE C-ARM 42X120 X-RAY (DRAPES) ×3 IMPLANT
DRAPE LAPAROSCOPIC ABDOMINAL (DRAPES) ×3 IMPLANT
DRAPE WARM FLUID 44X44 (DRAPE) ×3 IMPLANT
DRSG TEGADERM 4X4.75 (GAUZE/BANDAGES/DRESSINGS) ×3 IMPLANT
ELECT REM PT RETURN 9FT ADLT (ELECTROSURGICAL) ×3
ELECTRODE REM PT RTRN 9FT ADLT (ELECTROSURGICAL) ×1 IMPLANT
ENDOLOOP SUT PDS II  0 18 (SUTURE)
ENDOLOOP SUT PDS II 0 18 (SUTURE) IMPLANT
EVACUATOR SILICONE 100CC (DRAIN) IMPLANT
GAUZE SPONGE 2X2 8PLY STRL LF (GAUZE/BANDAGES/DRESSINGS) ×1 IMPLANT
GLOVE ECLIPSE 8.0 STRL XLNG CF (GLOVE) ×5 IMPLANT
GLOVE INDICATOR 8.0 STRL GRN (GLOVE) ×3 IMPLANT
GOWN STRL REUS W/TWL XL LVL3 (GOWN DISPOSABLE) ×6 IMPLANT
KIT BASIN OR (CUSTOM PROCEDURE TRAY) ×3 IMPLANT
PEN SKIN MARKING BROAD (MISCELLANEOUS) ×3 IMPLANT
POUCH SPECIMEN RETRIEVAL 10MM (ENDOMECHANICALS) IMPLANT
SCISSORS LAP 5X35 DISP (ENDOMECHANICALS) ×2 IMPLANT
SET CHOLANGIOGRAPH MIX (MISCELLANEOUS) ×3 IMPLANT
SET IRRIG TUBING LAPAROSCOPIC (IRRIGATION / IRRIGATOR) ×3 IMPLANT
SHEARS HARMONIC ACE PLUS 36CM (ENDOMECHANICALS) ×3 IMPLANT
SPONGE GAUZE 2X2 STER 10/PKG (GAUZE/BANDAGES/DRESSINGS) ×2
SUT MNCRL AB 4-0 PS2 18 (SUTURE) ×3 IMPLANT
SUT PDS AB 1 CT1 27 (SUTURE) ×6 IMPLANT
SYR 20CC LL (SYRINGE) ×3 IMPLANT
TOWEL OR 17X26 10 PK STRL BLUE (TOWEL DISPOSABLE) ×3 IMPLANT
TOWEL OR NON WOVEN STRL DISP B (DISPOSABLE) ×2 IMPLANT
TRAY LAPAROSCOPIC (CUSTOM PROCEDURE TRAY) ×3 IMPLANT
TROCAR BLADELESS OPT 5 100 (ENDOMECHANICALS) ×3 IMPLANT
TROCAR BLADELESS OPT 5 150 (ENDOMECHANICALS) ×3 IMPLANT

## 2015-07-25 NOTE — Transfer of Care (Signed)
Immediate Anesthesia Transfer of Care Note  Patient: Jeremiah Baker  Procedure(s) Performed: Procedure(s): LAPAROSCOPIC CHOLECYSTECTOMY SINGLE SITE WITH INTRAOPERATIVE CHOLANGIOGRAM (N/A)  Patient Location: PACU  Anesthesia Type:General  Level of Consciousness:  sedated, patient cooperative and responds to stimulation  Airway & Oxygen Therapy:Patient Spontanous Breathing and Patient connected to face mask oxgen  Post-op Assessment:  Report given to PACU RN and Post -op Vital signs reviewed and stable  Post vital signs:  Reviewed and stable  Last Vitals: There were no vitals filed for this visit.  Complications: No apparent anesthesia complications

## 2015-07-25 NOTE — Anesthesia Preprocedure Evaluation (Addendum)
Anesthesia Evaluation  Patient identified by MRN, date of birth, ID band Patient awake    Reviewed: Allergy & Precautions, NPO status , Patient's Chart, lab work & pertinent test results  Airway Mallampati: II  TM Distance: >3 FB Neck ROM: Full    Dental  (+) Teeth Intact   Pulmonary neg pulmonary ROS,    breath sounds clear to auscultation       Cardiovascular negative cardio ROS   Rhythm:Regular Rate:Normal     Neuro/Psych negative neurological ROS  negative psych ROS   GI/Hepatic Neg liver ROS, GERD  Medicated,  Endo/Other  negative endocrine ROS  Renal/GU negative Renal ROS  negative genitourinary   Musculoskeletal negative musculoskeletal ROS (+)   Abdominal   Peds negative pediatric ROS (+)  Hematology negative hematology ROS (+)   Anesthesia Other Findings   Reproductive/Obstetrics negative OB ROS                            Lab Results  Component Value Date   WBC 4.8 07/18/2015   HGB 14.8 07/18/2015   HCT 45.3 07/18/2015   MCV 83.6 07/18/2015   PLT 164 07/18/2015      Anesthesia Physical Anesthesia Plan  ASA: II  Anesthesia Plan: General   Post-op Pain Management:    Induction: Intravenous  Airway Management Planned: Oral ETT  Additional Equipment:   Intra-op Plan:   Post-operative Plan: Extubation in OR  Informed Consent: I have reviewed the patients History and Physical, chart, labs and discussed the procedure including the risks, benefits and alternatives for the proposed anesthesia with the patient or authorized representative who has indicated his/her understanding and acceptance.   Dental advisory given  Plan Discussed with: CRNA  Anesthesia Plan Comments:         Anesthesia Quick Evaluation

## 2015-07-25 NOTE — Discharge Instructions (Signed)
LAPAROSCOPIC SURGERY: POST OP INSTRUCTIONS ° °1. DIET: Follow a light bland diet the first 24 hours after arrival home, such as soup, liquids, crackers, etc.  Be sure to include lots of fluids daily.  Avoid fast food or heavy meals as your are more likely to get nauseated.  Eat a low fat the next few days after surgery.   °2. Take your usually prescribed home medications unless otherwise directed. °3. PAIN CONTROL: °a. Pain is best controlled by a usual combination of three different methods TOGETHER: °i. Ice/Heat °ii. Over the counter pain medication °iii. Prescription pain medication °b. Most patients will experience some swelling and bruising around the incisions.  Ice packs or heating pads (30-60 minutes up to 6 times a day) will help. Use ice for the first few days to help decrease swelling and bruising, then switch to heat to help relax tight/sore spots and speed recovery.  Some people prefer to use ice alone, heat alone, alternating between ice & heat.  Experiment to what works for you.  Swelling and bruising can take several weeks to resolve.   °c. It is helpful to take an over-the-counter pain medication regularly for the first few weeks.  Choose one of the following that works best for you: °i. Naproxen (Aleve, etc)  Two 220mg tabs twice a day °ii. Ibuprofen (Advil, etc) Three 200mg tabs four times a day (every meal & bedtime) °iii. Acetaminophen (Tylenol, etc) 500-650mg four times a day (every meal & bedtime) °d. A  prescription for pain medication (such as oxycodone, hydrocodone, etc) should be given to you upon discharge.  Take your pain medication as prescribed.  °i. If you are having problems/concerns with the prescription medicine (does not control pain, nausea, vomiting, rash, itching, etc), please call us [phone] to see if we need to switch you to a different pain medicine that will work better for you and/or control your side effect better. °ii. If you need a refill on your pain medication,  please contact your pharmacy.  They will contact our office to request authorization. Prescriptions will not be filled after 5 pm or on week-ends. °4. Avoid getting constipated.  Between the surgery and the pain medications, it is common to experience some constipation.  Increasing fluid intake and taking a fiber supplement (such as Metamucil, Citrucel, FiberCon, MiraLax, etc) 1-2 times a day regularly will usually help prevent this problem from occurring.  A mild laxative (prune juice, Milk of Magnesia, MiraLax, etc) should be taken according to package directions if there are no bowel movements after 48 hours.   °5. Watch out for diarrhea.  If you have many loose bowel movements, simplify your diet to bland foods & liquids for a few days.  Stop any stool softeners and decrease your fiber supplement.  Switching to mild anti-diarrheal medications (Kayopectate, Pepto Bismol) can help.  If this worsens or does not improve, please call us. °6. Wash / shower every day.  You may shower over the dressings as they are waterproof.  Continue to shower over incision(s) after the dressing is off. °7. Remove your waterproof bandages 5 days after surgery.  You may leave the incision open to air.  You may replace a dressing/Band-Aid to cover the incision for comfort if you wish.  °8. ACTIVITIES as tolerated:   °a. You may resume regular (light) daily activities beginning the next day--such as daily self-care, walking, climbing stairs--gradually increasing activities as tolerated.  If you can walk 30 minutes without difficulty, it   is safe to try more intense activity such as jogging, treadmill, bicycling, low-impact aerobics, swimming, etc. °b. Save the most intensive and strenuous activity for last such as sit-ups, heavy lifting, contact sports, etc  Refrain from any heavy lifting or straining until you are off narcotics for pain control.   °c. DO NOT PUSH THROUGH PAIN.  Let pain be your guide: If it hurts to do something, don't  do it.  Pain is your body warning you to avoid that activity for another week until the pain goes down. °d. You may drive when you are no longer taking prescription pain medication, you can comfortably wear a seatbelt, and you can safely maneuver your car and apply brakes. °e. You may have sexual intercourse when it is comfortable.  °9. FOLLOW UP in our office °a. Please call [facility] at [phone] to set up an appointment to see your surgeon in the office for a follow-up appointment approximately 2-3 weeks after your surgery. °b. Make sure that you call for this appointment the day you arrive home to insure a convenient appointment time. °10. IF YOU HAVE DISABILITY OR FAMILY LEAVE FORMS, BRING THEM TO THE OFFICE FOR PROCESSING.  DO NOT GIVE THEM TO YOUR DOCTOR. ° ° °WHEN TO CALL US [phone]: °1. Poor pain control °2. Reactions / problems with new medications (rash/itching, nausea, etc)  °3. Fever over 101.5 F (38.5 C) °4. Inability to urinate °5. Nausea and/or vomiting °6. Worsening swelling or bruising °7. Continued bleeding from incision. °8. Increased pain, redness, or drainage from the incision ° ° The clinic staff is available to answer your questions during regular business hours (8:30am-5pm).  Please don’t hesitate to call and ask to speak to one of our nurses for clinical concerns.  ° If you have a medical emergency, go to the nearest emergency room or call 911. ° A surgeon from [facility] is always on call at the hospitals ° ° °[facility] °[address], [address], [place], [place]  [address] ? °MAIN: [phone] ? TOLL FREE: [phone] ?  °FAX [phone] °[email or web address] ° °Cholecystitis °Cholecystitis is an inflammation of your gallbladder. It is usually caused by a buildup of gallstones or sludge (cholelithiasis) in your gallbladder. The gallbladder stores a fluid that helps digest fats (bile). Cholecystitis is serious and needs  treatment right away.  °CAUSES  °· Gallstones. Gallstones can block the tube that leads to your gallbladder, causing bile to build up. As bile builds up, the gallbladder becomes inflamed. °· Bile duct problems, such as blockage from scarring or kinking. °· Tumors. Tumors can stop bile from leaving your gallbladder correctly, causing bile to build up. As bile builds up, the gallbladder becomes inflamed. °SYMPTOMS  °· Nausea. °· Vomiting. °· Abdominal pain, especially in the upper right area of your abdomen. °· Abdominal tenderness or bloating. °· Sweating. °· Chills. °· Fever. °· Yellowing of the skin and the whites of the eyes (jaundice). °DIAGNOSIS  °Your caregiver may order blood tests to look for infection or gallbladder problems. Your caregiver may also order imaging tests, such as an ultrasound or computed tomography (CT) scan. Further tests may include a hepatobiliary iminodiacetic acid (HIDA) scan. This scan allows your caregiver to see your bile move from the liver to the gallbladder and to the small intestine. °TREATMENT  °A hospital stay is usually necessary to lessen the inflammation of your gallbladder. You may be required to not eat or drink (fast) for a   certain amount of time. You may be given medicine to treat pain or an antibiotic medicine to treat an infection. Surgery may be needed to remove your gallbladder (cholecystectomy) once the inflammation has gone down. Surgery may be needed right away if you develop complications such as death of gallbladder tissue (gangrene) or a tear (perforation) of the gallbladder.  °HOME CARE INSTRUCTIONS  °Home care will depend on your treatment. In general: °· If you were given antibiotics, take them as directed. Finish them even if you start to feel better. °· Only take over-the-counter or prescription medicines for pain, discomfort, or fever as directed by your caregiver. °· Follow a low-fat diet until you see your caregiver again. °· Keep all follow-up visits as  directed by your caregiver. °SEEK IMMEDIATE MEDICAL CARE IF:  °· Your pain is increasing and not controlled by medicines. °· Your pain moves to another part of your abdomen or to your back. °· You have a fever. °· You have nausea and vomiting. °MAKE SURE YOU: °· Understand these instructions. °· Will watch your condition. °· Will get help right away if you are not doing well or get worse. °Document Released: 10/12/2005 Document Revised: 01/04/2012 Document Reviewed: 08/28/2011 °Exit[employer]® Patient Information ©2015 [employer]. This information is not intended to replace advice given to you by your health care provider. Make sure you discuss any questions you have with your health care provider. ° °GETTING TO GOOD BOWEL HEALTH. °Irregular bowel habits such as constipation and diarrhea can lead to many problems over time.  Having one soft bowel movement a day is the most important way to prevent further problems.  The anorectal canal is designed to handle stretching and feces to safely manage our ability to get rid of solid waste (feces, poop, stool) out of our body.  BUT, hard constipated stools can act like ripping concrete bricks and diarrhea can be a burning fire to this very sensitive area of our body, causing inflamed hemorrhoids, anal fissures, increasing risk is perirectal abscesses, abdominal pain/bloating, an making irritable bowel worse.     ° °The goal: ONE SOFT BOWEL MOVEMENT A DAY!  To have soft, regular bowel movements:  °• Drink plenty of fluids, consider 4-6 tall glasses of water a day.   °• Take plenty of fiber.  Fiber is the undigested part of plant food that passes into the colon, acting s “natures broom” to encourage bowel motility and movement.  Fiber can absorb and hold large amounts of water. This results in a larger, bulkier stool, which is soft and easier to pass. Work gradually over several weeks up to 6 servings a day of fiber (25g a day even more if needed) in the form of: °o Vegetables  -- Root (potatoes, carrots, turnips), leafy green (lettuce, salad greens, celery, spinach), or cooked high residue (cabbage, broccoli, etc) °o Fruit -- Fresh (unpeeled skin & pulp), Dried (prunes, apricots, cherries, etc ),  or stewed ( applesauce)  °o Whole grain breads, pasta, etc (whole wheat)  °o Bran cereals  °• Bulking Agents -- This type of water-retaining fiber generally is easily obtained each day by one of the following:  °o Psyllium bran -- The psyllium plant is remarkable because its ground seeds can retain so much water. This product is available as Metamucil, Konsyl, Effersyllium, Per Diem Fiber, or the less expensive generic preparation in drug and health food stores. Although labeled a laxative, it really is not a laxative.  °o Methylcellulose -- This is another   fiber derived from wood which also retains water. It is available as Citrucel. °o Polyethylene Glycol - and “artificial” fiber commonly called Miralax or Glycolax.  It is helpful for people with gassy or bloated feelings with regular fiber °o Flax Seed - a less gassy fiber than psyllium °• No reading or other relaxing activity while on the toilet. If bowel movements take longer than 5 minutes, you are too constipated °• AVOID CONSTIPATION.  High fiber and water intake usually takes care of this.  Sometimes a laxative is needed to stimulate more frequent bowel movements, but  °• Laxatives are not a good long-term solution as it can wear the colon out.  They can help jump-start bowels if constipated, but should be relied on constantly without discussing with your doctor °o Osmotics (Milk of Magnesia, Fleets phosphosoda, Magnesium citrate, MiraLax, GoLytely) are safer than  °o Stimulants (Senokot, Castor Oil, Dulcolax, Ex Lax)    °o Avoid taking laxatives for more than 7 days in a row. °•  IF SEVERELY CONSTIPATED, try a Bowel Retraining Program: °o Do not use laxatives.  °o Eat a diet high in roughage, such as bran cereals and leafy vegetables.   °o Drink six (6) ounces of prune or apricot juice each morning.  °o Eat two (2) large servings of stewed fruit each day.  °o Take one (1) heaping tablespoon of a psyllium-based bulking agent twice a day. Use sugar-free sweetener when possible to avoid excessive calories.  °o Eat a normal breakfast.  °o Set aside 15 minutes after breakfast to sit on the toilet, but do not strain to have a bowel movement.  °o If you do not have a bowel movement by the third day, use an enema and repeat the above steps.  °• Controlling diarrhea °o Switch to liquids and simpler foods for a few days to avoid stressing your intestines further. °o Avoid dairy products (especially milk & ice cream) for a short time.  The intestines often can lose the ability to digest lactose when stressed. °o Avoid foods that cause gassiness or bloating.  Typical foods include beans and other legumes, cabbage, broccoli, and dairy foods.  Every person has some sensitivity to other foods, so listen to our body and avoid those foods that trigger problems for you. °o Adding fiber (Citrucel, Metamucil, psyllium, Miralax) gradually can help thicken stools by absorbing excess fluid and retrain the intestines to act more normally.  Slowly increase the dose over a few weeks.  Too much fiber too soon can backfire and cause cramping & bloating. °o Probiotics (such as active yogurt, Align, etc) may help repopulate the intestines and colon with normal bacteria and calm down a sensitive digestive tract.  Most studies show it to be of mild help, though, and such products can be costly. °o Medicines: °- Bismuth subsalicylate (ex. Kayopectate, Pepto Bismol) every 30 minutes for up to 6 doses can help control diarrhea.  Avoid if pregnant. °- Loperamide (Immodium) can slow down diarrhea.  Start with two tablets (4mg total) first and then try one tablet every 6 hours.  Avoid if you are having fevers or severe pain.  If you are not better or start feeling worse, stop all  medicines and call your doctor for advice °o Call your doctor if you are getting worse or not better.  Sometimes further testing (cultures, endoscopy, X-ray studies, bloodwork, etc) may be needed to help diagnose and treat the cause of the diarrhea. ° °TROUBLESHOOTING IRREGULAR BOWELS °1) Avoid   extremes of bowel movements (no bad constipation/diarrhea) °2) Miralax 17gm mixed in 8oz. water or juice-daily. May use BID as needed.  °3) Gas-x,Phazyme, etc. as needed for gas & bloating.  °4) Soft,bland diet. No spicy,greasy,fried foods.  °5) Prilosec over-the-counter as needed  °6) May hold gluten/wheat products from diet to see if symptoms improve.  °7)  May try probiotics (Align, Activa, etc) to help calm the bowels down °7) If symptoms become worse call back immediately. ° °Managing Pain ° °Pain after surgery or related to activity is often due to strain/injury to muscle, tendon, nerves and/or incisions.  This pain is usually short-term and will improve in a few months.  ° °Many people find it helpful to do the following things TOGETHER to help speed the process of healing and to get back to regular activity more quickly: ° °1. Avoid heavy physical activity at first °a. No lifting greater than 20 pounds at first, then increase to lifting as tolerated over the next few weeks °b. Do not “push through” the pain.  Listen to your body and avoid positions and maneuvers than reproduce the pain.  Wait a few days before trying something more intense °c. Walking is okay as tolerated, but go slowly and stop when getting sore.  If you can walk 30 minutes without stopping or pain, you can try more intense activity (running, jogging, aerobics, cycling, swimming, treadmill, sex, sports, weightlifting, etc ) °d. Remember: If it hurts to do it, then don’t do it! ° °2. Take Anti-inflammatory medication °a. Choose ONE of the following over-the-counter medications: °i.            Acetaminophen 500mg tabs (Tylenol) 1-2 pills with every  meal and just before bedtime (avoid if you have liver problems) °ii.            Naproxen 220mg tabs (ex. Aleve) 1-2 pills twice a day (avoid if you have kidney, stomach, IBD, or bleeding problems) °iii. Ibuprofen 200mg tabs (ex. Advil, Motrin) 3-4 pills with every meal and just before bedtime (avoid if you have kidney, stomach, IBD, or bleeding problems) °b. Take with food/snack around the clock for 1-2 weeks °i. This helps the muscle and nerve tissues become less irritable and calm down faster ° °3. Use a Heating pad or Ice/Cold Pack °a. 4-6 times a day °b. May use warm bath/hottub  or showers ° °4. Try Gentle Massage and/or Stretching  °a. at the area of pain many times a day °b. stop if you feel pain - do not overdo it ° °Try these steps together to help you body heal faster and avoid making things get worse.  Doing just one of these things may not be enough.   ° °If you are not getting better after two weeks or are noticing you are getting worse, contact our office for further advice; we may need to re-evaluate you & see what other things we can do to help. ° °

## 2015-07-25 NOTE — Op Note (Signed)
07/25/2015  4:12 PM  PATIENT:  Jeremiah Baker  39 y.o. male  Patient Care Team: Darrow Bussing, MD as PCP - General (Family Medicine)  PRE-OPERATIVE DIAGNOSIS:  Biliary Colic  POST-OPERATIVE DIAGNOSIS:  Biliary Colic  PROCEDURE:  Procedure(s): LAPAROSCOPIC CHOLECYSTECTOMY SINGLE SITE WITH INTRAOPERATIVE CHOLANGIOGRAM  SURGEON:  Surgeon(s): Karie Soda, MD  ASSISTANT: RN   ANESTHESIA:   local and general  EBL:  Total I/O In: 1000 [I.V.:1000] Out: 15 [Blood:15]  Delay start of Pharmacological VTE agent (>24hrs) due to surgical blood loss or risk of bleeding:  no  DRAINS: none   SPECIMEN:  Source of Specimen:  Gallbladder   DISPOSITION OF SPECIMEN:  PATHOLOGY  COUNTS:  YES  PLAN OF CARE: Discharge to home after PACU  PATIENT DISPOSITION:  PACU - hemodynamically stable.  INDICATION: Pleasant gentleman with episode of upper abdominal pain with ultrasound showing sludge and probable stones.  Rest of workup otherwise negative.  Suspicious for biliary colic.  Cholecystectomy offered.  The anatomy & physiology of hepatobiliary & pancreatic function was discussed.  The pathophysiology of gallbladder dysfunction was discussed.  Natural history risks without surgery was discussed.   I feel the risks of no intervention will lead to serious problems that outweigh the operative risks; therefore, I recommended cholecystectomy to remove the pathology.  I explained laparoscopic techniques with possible need for an open approach.  Probable cholangiogram to evaluate the bilary tract was explained as well.    Risks such as bleeding, infection, abscess, leak, injury to other organs, need for further treatment, heart attack, death, and other risks were discussed.  I noted a good likelihood this will help address the problem.  Possibility that this will not correct all abdominal symptoms was explained.  Goals of post-operative recovery were discussed as well.  We will work to minimize  complications.  An educational handout further explaining the pathology and treatment options was given as well.  Questions were answered.  The patient expresses understanding & wishes to proceed with surgery.   OR FINDINGS: Dilated gallbladder.  Cholangiole shows no distal common bile duct stones obstruction and good flow in the duodenum.  Not able to get good reflux into the intrahepatic common bile duct or other chains.  DESCRIPTION:   The patient was identified & brought in the operating room. The patient was positioned supine with arms tucked. SCDs were active during the entire case. The patient underwent general anesthesia without any difficulty.  The abdomen was prepped and draped in a sterile fashion. A Surgical Timeout confirmed our plan.  I made a transverse curvilinear incision through the superior umbilical fold.  I placed a 5mm long port through the supraumbilical fascia using a modified Hassan cutdown technique. I began carbon dioxide insufflation. Camera inspection revealed no injury. There were no adhesions to the anterior abdominal wall supraumbilically.  I proceeded to continue with single site technique. I placed a #5 port in left upper aspect of the wound. I placed a 5 mm atraumatic grasper in the right inferior aspect of the wound.  I turned attention to the right upper quadrant.  the gallbladder was rather dilated and intrahepatic.The gallbladder fundus was elevated cephalad. I freed the peritoneal coverings between the gallbladder and the liver on the posteriolateral and anteriomedial walls. I alternated between Harmonic & blunt Maryland dissection to help get a good critical view of the cystic artery and cystic duct. I did further dissection to free 80% of the gallbladder off the liver bed to get a  good critical view of the infundibulum and cystic duct. I mobilized the cystic artery; and, after getting a good 360 view, ligated the cystic artery using the Harmonic ultrasonic  dissection. I skeletonized the cystic duct.  I placed a clip on the infundibulum. I did a partial cystic duct-otomy and ensured patency. I placed a 5 [place] cholangiocatheter through a puncture site at the right subcostal ridge of the abdominal wall and directed it into the cystic duct.  We ran a cholangiogram with dilute radio-opaque contrast and continuous fluoroscopy. Contrast flowed from a side branch consistent with cystic duct cannulization.  Contrast flowed down the common bile duct easily across the normal ampulla into the duodenum.   I could not get it to reflux up the common bile and hepatic ducts.  However enter an excellent critical view and felt confident and stayed far away from the easily identified common bile duct.   I removed the cholangiocatheter. I placed clips on the cystic duct x4.  I completed cystic duct transection. I freed the gallbladder from its remaining attachments to the liver. it was rather and hepatic with no good plane between the gallbladder fossa and the gallbladder itself.  I ended up resecting a rim of liver with the gallbladder.  I ensured hemostasis on the gallbladder fossa of the liver and elsewhere. I inspected the rest of the abdomen & detected no injury nor bleeding elsewhere.  I removed the gallbladder out the supraumbilical fascia. I closed the fascia transversely using 0 Vicryl interrupted stitches. A closed the skin using 4-0 monocryl stitch.  Sterile dressing was applied. The patient was extubated & arrived in the PACU in stable condition..  I had discussed postoperative care with the patient in the holding area. I am about to locate the patient's family and discuss operative findings and postoperative goals / instructions.  Instructions are written in the chart as well.  Ardeth Sportsman, M.D., F.A.C.S. Gastrointestinal and Minimally Invasive Surgery [facility] [address] [place], [place] [address] [phone]  Main / Paging

## 2015-07-25 NOTE — Anesthesia Procedure Notes (Signed)
Procedure Name: Intubation Date/Time: 07/25/2015 2:58 PM Performed by: Orest Dikes Pre-anesthesia Checklist: Patient identified, Emergency Drugs available, Suction available and Patient being monitored Patient Re-evaluated:Patient Re-evaluated prior to inductionOxygen Delivery Method: Circle System Utilized Preoxygenation: Pre-oxygenation with 100% oxygen Intubation Type: IV induction Ventilation: Mask ventilation without difficulty Laryngoscope Size: Mac and 4 Grade View: Grade I Tube type: Oral Tube size: 7.5 mm Number of attempts: 1 Airway Equipment and Method: Stylet Placement Confirmation: ETT inserted through vocal cords under direct vision,  positive ETCO2 and breath sounds checked- equal and bilateral Secured at: 21 cm Tube secured with: Tape Dental Injury: Teeth and Oropharynx as per pre-operative assessment

## 2015-07-25 NOTE — Interval H&P Note (Signed)
History and Physical Interval Note:  07/25/2015 2:10 PM  Jeremiah Baker  has presented today for surgery, with the diagnosis of Biliary Colic  The various methods of treatment have been discussed with the patient and family. After consideration of risks, benefits and other options for treatment, the patient has consented to  Procedure(s): LAPAROSCOPIC CHOLECYSTECTOMY SINGLE SITE WITH INTRAOPERATIVE CHOLANGIOGRAM (N/A) as a surgical intervention .  The patient's history has been reviewed, patient examined, no change in status, stable for surgery.  I have reviewed the patient's chart and labs.  Questions were answered to the patient's satisfaction.     GROSS,STEVEN C.

## 2015-07-25 NOTE — Anesthesia Postprocedure Evaluation (Signed)
  Anesthesia Post-op Note  Patient: Jeremiah Baker  Procedure(s) Performed: Procedure(s): LAPAROSCOPIC CHOLECYSTECTOMY SINGLE SITE WITH INTRAOPERATIVE CHOLANGIOGRAM (N/A)  Patient Location: PACU  Anesthesia Type:General  Level of Consciousness: awake, alert  and oriented  Airway and Oxygen Therapy: Patient Spontanous Breathing  Post-op Pain: minimal  Post-op Assessment: Post-op Vital signs reviewed and Patient's Cardiovascular Status Stable              Post-op Vital Signs: Reviewed and stable  Last Vitals:  Filed Vitals:   07/25/15 1715  BP: 148/83  Pulse: 90  Temp:   Resp: 11    Complications: No apparent anesthesia complications

## 2015-07-25 NOTE — H&P (Signed)
Jeremiah Baker 06/19/2015 8:36 AM Location: [facility] Patient #: [id number] DOB: 06-22-1976 Married / Language: [name] / Race: White Male  History of Present Illness   Patient words: gallbladder.  The patient is a 39 year old male who presents for evaluation of gall stones. Patient sent for surgical consultation by his gastroenterologist Dr. Yancey Flemings with [facility]. Concern for upper abdominal pain and gallbladder sludge/stones. Young active male. History of intermittent epigastric pain starting last Christmas Eve. Bloating, nausea/vomiting after eating Tiramisu. Again on New Years Eve. Both episodes happened after eating a rich dessert and after having some salmon. Remembers getting amoxillin for sore throat - no more GI Sx for a few weeks. However, intermittent recurring episodes of nausea and bloating. Usually worse in the RIGHT upper quadrant. Discussed with his Aunt whom is a gastroenterologist. Saw his PCP. Concern perhaps it was related to reflux but no improvement on proton pump inhibitors. Saw GI, and upper endoscopy earlier this month appeared normal. Given ultrasound showed evidence of gallstones/sludge, surgical consultation requested to see if gallbladder as possible etiology and that this is biliary colic. Eating less, losing weight (15lbs over past year). He works out at [employer] 4 times a week. Used to take moderate volume of nutritional supplements. When diagnosed with gallstones backed off to just mild intermittent protein supplement. He claims he can eat large meals without much difficulty. Usually has about one every day. He gets attacks about twice a week now. Not particularly more intense. No increased belching or reflux. No problems with reflux or heartburn while supine or bending over. No sick contacts. No travel history. Originally much of his family is from [place] but no recent travels. He had been avoiding coffee and tomato and acid  foods without much improvement. He rarely drinks alcohol. Only intermittent use of nonsteroidals. Certainly not on a weekly let alone daily. No history of hepatitis or pancreatitis. No history of jaundice. Does not smoke. No prior abdominal surgery. No history of Crohn's. No ulcerative colitis. No irritable bowel syndrome. No inflammatory bowel disease.   Other Problems Fay Records, CMA; 06/19/2015 8:36 AM) Kidney Larina Bras  Past Surgical History Fay Records, [place]; 06/19/2015 8:36 AM) No pertinent past surgical history  Diagnostic Studies History Fay Records, [place]; 06/19/2015 8:36 AM) Colonoscopy never  Allergies Fay Records, CMA; 06/19/2015 8:36 AM) Shellfish Hives.  Medication History Fay Records, CMA; 06/19/2015 8:37 AM) Pantoprazole Sodium (  Tablet DR, Oral daily) Active. Medications Reconciled  Social History Fay Records, [place]; 06/19/2015 8:36 AM) Caffeine use Tea. No alcohol use No drug use Tobacco use Never smoker.  Family History Fay Records, [place]; 06/19/2015 8:36 AM) Cancer Father. Hypertension Mother.  Review of Systems Fay Records CMA; 06/19/2015 8:36 AM) General Present- Appetite Loss and Weight Loss. Not Present- Chills, Fatigue, Fever, Night Sweats and Weight Gain. Skin Not Present- Change in Wart/Mole, Dryness, Hives, Jaundice, New Lesions, Non-Healing Wounds, Rash and Ulcer. HEENT Present- Seasonal Allergies. Not Present- Earache, Hearing Loss, Hoarseness, Nose Bleed, Oral Ulcers, Ringing in the Ears, Sinus Pain, Sore Throat, Visual Disturbances, Wears glasses/contact lenses and Yellow Eyes. Respiratory Not Present- Bloody sputum, Chronic Cough, Difficulty Breathing, Snoring and Wheezing. Breast Not Present- Breast Mass, Breast Pain, Nipple Discharge and Skin Changes. Cardiovascular Not Present- Chest Pain, Difficulty Breathing Lying Down, Leg Cramps, Palpitations, Rapid Heart Rate, Shortness of Breath and Swelling of  Extremities. Gastrointestinal Present- Bloating and Nausea. Not Present- Abdominal Pain, Bloody Stool, Change in Bowel Habits, Chronic diarrhea, Constipation, Difficulty Swallowing, Excessive  gas, Gets full quickly at meals, Hemorrhoids, Indigestion, Rectal Pain and Vomiting. Male Genitourinary Not Present- Blood in Urine, Change in Urinary Stream, Frequency, Impotence, Nocturia, Painful Urination, Urgency and Urine Leakage. Musculoskeletal Not Present- Back Pain, Joint Pain, Joint Stiffness, Muscle Pain, Muscle Weakness and Swelling of Extremities. Neurological Not Present- Decreased Memory, Fainting, Headaches, Numbness, Seizures, Tingling, Tremor, Trouble walking and Weakness. Psychiatric Not Present- Anxiety, Bipolar, Change in Sleep Pattern, Depression, Fearful and Frequent crying. Endocrine Not Present- Cold Intolerance, Excessive Hunger, Hair Changes, Heat Intolerance, Hot flashes and New Diabetes. Hematology Not Present- Easy Bruising, Excessive bleeding, Gland problems, HIV and Persistent Infections.   Vitals Fay Records CMA; 06/19/2015 8:37 AM) 06/19/2015 8:37 AM Weight: 156 lb Height: 67in Body Surface Area: 1.83 m Body Mass Index: 24.43 kg/m Temp.: 97.61F(Oral)  Pulse: 85 (Regular)  BP: 128/78 (Sitting, Left Arm, Standard)    Physical Exam Ardeth Sportsman MD; 06/19/2015 1:40 PM) General Mental Status-Alert. General Appearance-Not in acute distress, Not Sickly. Orientation-Oriented X3. Hydration-Well hydrated. Voice-Normal. Note: Pleasant. Talkative.   Integumentary Global Assessment Upon inspection and palpation of skin surfaces of the - Axillae: non-tender, no inflammation or ulceration, no drainage. and Distribution of scalp and body hair is normal. General Characteristics Temperature - normal warmth is noted.  Head and Neck Head-normocephalic, atraumatic with no lesions or palpable masses. Face Global Assessment - atraumatic, no absence  of expression. Neck Global Assessment - no abnormal movements, no bruit auscultated on the right, no bruit auscultated on the left, no decreased range of motion, non-tender. Trachea-midline. Thyroid Gland Characteristics - non-tender.  Eye Eyeball - Left-Extraocular movements intact, No Nystagmus. Eyeball - Right-Extraocular movements intact, No Nystagmus. Cornea - Left-No Hazy. Cornea - Right-No Hazy. Sclera/Conjunctiva - Left-No scleral icterus, No Discharge. Sclera/Conjunctiva - Right-No scleral icterus, No Discharge. Pupil - Left-Direct reaction to light normal. Pupil - Right-Direct reaction to light normal.  ENMT Ears Pinna - Left - no drainage observed, no generalized tenderness observed. Right - no drainage observed, no generalized tenderness observed. Nose and Sinuses External Inspection of the Nose - no destructive lesion observed. Inspection of the nares - Left - quiet respiration. Right - quiet respiration. Mouth and Throat Lips - Upper Lip - no fissures observed, no pallor noted. Lower Lip - no fissures observed, no pallor noted. Nasopharynx - no discharge present. Oral Cavity/Oropharynx - Tongue - no dryness observed. Oral Mucosa - no cyanosis observed. Hypopharynx - no evidence of airway distress observed.  Chest and Lung Exam Inspection Movements - Normal and Symmetrical. Accessory muscles - No use of accessory muscles in breathing. Palpation Palpation of the chest reveals - Non-tender. Auscultation Breath sounds - Normal and Clear.  Cardiovascular Auscultation Rhythm - Regular. Murmurs & Other Heart Sounds - Auscultation of the heart reveals - No Murmurs and No Systolic Clicks.  Abdomen Inspection Inspection of the abdomen reveals - No Visible peristalsis and No Abnormal pulsations. Umbilicus - No Bleeding, No Urine drainage. Palpation/Percussion Palpation and Percussion of the abdomen reveal - Soft, Non Tender, No Rebound tenderness, No  Rigidity (guarding) and No Cutaneous hyperesthesia. Note: Soft and flat. No diastases. No guarding. No Murphy sign. No umbilical hernia.   Male Genitourinary Sexual Maturity Tanner 5 - Adult hair pattern and Adult penile size and shape. Note: No inguinal hernias. Normal external genitalia.   Peripheral Vascular Upper Extremity Inspection - Left - No Cyanotic nailbeds, Not Ischemic. Right - No Cyanotic nailbeds, Not Ischemic.  Neurologic Neurologic evaluation reveals -normal attention span and ability to concentrate,  able to name objects and repeat phrases. Appropriate fund of knowledge , normal sensation and normal coordination. Mental Status Affect - not angry, not paranoid. Cranial Nerves-Normal Bilaterally. Gait-Normal.  Neuropsychiatric Mental status exam performed with findings of-able to articulate well with normal speech/language, rate, volume and coherence, thought content normal with ability to perform basic computations and apply abstract reasoning and no evidence of hallucinations, delusions, obsessions or homicidal/suicidal ideation.  Musculoskeletal Global Assessment Spine, Ribs and Pelvis - no instability, subluxation or laxity. Right Upper Extremity - no instability, subluxation or laxity.  Lymphatic Head & Neck  General Head & Neck Lymphatics: Bilateral - Description - No Localized lymphadenopathy. Axillary  General Axillary Region: Bilateral - Description - No Localized lymphadenopathy. Femoral & Inguinal  Generalized Femoral & Inguinal Lymphatics: Left - Description - No Localized lymphadenopathy. Right - Description - No Localized lymphadenopathy.    Assessment & Plan Ardeth Sportsman MD; 06/19/2015 1:40 PM) SYMPTOMATIC CHOLELITHIASIS (574.20  K80.20) Impression: His history of postprandial nausea and bloating and upper abdominal especially RIGHT upper quadrant pain is suspicious for biliary colic. Negative gastroenterology workup and lack of  improvement on an antacid regimen helps rule out out. Suspicion of other digestive tract her cardiopulmonary issues very low as well. I think he would benefit from cholecystectomy. Reasonable single site approach.  Another possibility is perhaps delayed gastric emptying been in a healthy nondiabetic male without chronic narcotics or other complicated history, I'm skeptical that that is the etiology. No retained food on EGD. He does not have severe belching/heartburn/constipation.  He seems more convinced that it is his gallbladder after talking with him for 45 minutes. He wants to think about things and will call after he discussed with his aunt who is a gastroenterologist & rest of his family. Current Plans  You are being scheduled for surgery - Our schedulers will call you.  You should hear from our office's scheduling department within 5 working days about the location, date, and time of surgery. We try to make accommodations for patient's preferences in scheduling surgery, but sometimes the OR schedule or the surgeon's schedule prevents [place] from making those accommodations.  If you have not heard from our office [phone]) in 5 working days, call the office and ask for your surgeon's nurse.  If you have other questions about your diagnosis, plan, or surgery, call the office and ask for your surgeon's nurse. Pt Education - Pamphlet Given - Laparoscopic Gallbladder Surgery: discussed with patient and provided information.   The anatomy & physiology of hepatobiliary & pancreatic function was discussed. The pathophysiology of gallbladder dysfunction was discussed. Natural history risks without surgery was discussed. I feel the risks of no intervention will lead to serious problems that outweigh the operative risks; therefore, I recommended cholecystectomy to remove the pathology. I explained laparoscopic techniques with possible need for an open approach. Probable cholangiogram to evaluate the  bilary tract was explained as well.  Risks such as bleeding, infection, abscess, leak, injury to other organs, need for further treatment, heart attack, death, and other risks were discussed. I noted a good likelihood this will help address the problem. Possibility that this will not correct all abdominal symptoms was explained. Goals of post-operative recovery were discussed as well. We will work to minimize complications. An educational handout further explaining the pathology and treatment options was given as well. Questions were answered. The patient expresses understanding & wishes to proceed with surgery. Pt Education - [facility] Laparosopic Post Op HCI (Gross) Pt Education -  [facility] Good Bowel Health (Gross) Pt Education - Laparoscopic Cholecystectomy: gallbladder  Ardeth Sportsman, M.D., F.A.C.S. Gastrointestinal and Minimally Invasive Surgery [facility] [address] [place], [place] [address] [phone] Main / Paging

## 2015-07-26 ENCOUNTER — Encounter (HOSPITAL_COMMUNITY): Payer: Self-pay | Admitting: Surgery

## 2015-10-11 ENCOUNTER — Other Ambulatory Visit: Payer: Self-pay | Admitting: Family Medicine

## 2015-10-11 DIAGNOSIS — R945 Abnormal results of liver function studies: Principal | ICD-10-CM

## 2015-10-11 DIAGNOSIS — R7989 Other specified abnormal findings of blood chemistry: Secondary | ICD-10-CM

## 2016-01-07 ENCOUNTER — Ambulatory Visit (INDEPENDENT_AMBULATORY_CARE_PROVIDER_SITE_OTHER): Payer: 59 | Admitting: Internal Medicine

## 2016-01-07 ENCOUNTER — Encounter: Payer: Self-pay | Admitting: Internal Medicine

## 2016-01-07 VITALS — BP 138/74 | HR 72

## 2016-01-07 DIAGNOSIS — K625 Hemorrhage of anus and rectum: Secondary | ICD-10-CM | POA: Diagnosis not present

## 2016-01-07 DIAGNOSIS — R945 Abnormal results of liver function studies: Secondary | ICD-10-CM

## 2016-01-07 DIAGNOSIS — R194 Change in bowel habit: Secondary | ICD-10-CM | POA: Diagnosis not present

## 2016-01-07 DIAGNOSIS — R140 Abdominal distension (gaseous): Secondary | ICD-10-CM

## 2016-01-07 DIAGNOSIS — R7989 Other specified abnormal findings of blood chemistry: Secondary | ICD-10-CM | POA: Diagnosis not present

## 2016-01-07 MED ORDER — RIFAXIMIN 550 MG PO TABS: 550.0000 mg | ORAL_TABLET | Freq: Three times a day (TID) | ORAL | Status: DC

## 2016-01-07 MED ORDER — NA SULFATE-K SULFATE-MG SULF 17.5-3.13-1.6 GM/177ML PO SOLN: 1.0000 | Freq: Once | ORAL | Status: DC

## 2016-01-07 NOTE — Progress Notes (Signed)
HISTORY OF PRESENT ILLNESS:  Jeremiah Baker is a 40 y.o. male who presents today with multiple GI complaints as described below. He was last evaluated in the office 05/17/2015 regarding epigastric pain and nausea. See that dictation. Upper endoscopy was performed and found to be normal. PPI, which was not helping, was discontinued. Abdominal ultrasound was obtained and revealed echogenic debris within the gallbladder which may represent small stones or cholesterol crystals. He subsequently underwent laparoscopic cholecystectomy with negative intraoperative cholangiogram September 2016. The patient brings with him outside blood work. He was noted to transiently have mild elevation of ALT which was normal on most recent evaluation 12/17/2015. Recent CBC was also normal. Patient #1 complaint is abdominal bloating associated with uncomfortable abdomen and decreased appetite. His also noticed hyperactive bowel sounds. Occasionally his bowel habits will be loose. This is a change since cholecystectomy. He has had fluctuating weight but overall stable. Last week he had an episode of nausea with vomiting after eating multiple food items. Last winter he described multiple episodes of significant blood and mucus per rectum. He brings a picture on his I phone for my review. Confirmed. He is very anxious regarding his symptoms. He is concerned about what food items might be able to eat without having GI symptoms. His father had gastric lymphoma.  REVIEW OF SYSTEMS:  All non-GI ROS negative upon comprehensive review  Past Medical History  Diagnosis Date  . Kidney stones     Past Surgical History  Procedure Laterality Date  . Wisdom tooth extraction    . Laparoscopic cholecystectomy single site with intraoperative cholangiogram N/A 07/25/2015    Procedure: LAPAROSCOPIC CHOLECYSTECTOMY SINGLE SITE WITH INTRAOPERATIVE CHOLANGIOGRAM;  Surgeon: Karie SodaSteven Gross, MD;  Location: [facility];  Service: General;  Laterality:  N/A;    Social History Jeremiah Baker  reports that he has never smoked. He has never used smokeless tobacco. He reports that he does not drink alcohol or use illicit drugs.  family history includes Colon polyps in his mother; Hypertension in his mother; Non-Hodgkin's lymphoma in his father. There is no history of Colon cancer, Esophageal cancer, Stomach cancer, or Rectal cancer.  Allergies  Allergen Reactions  . Shellfish Allergy Hives    Reaction to shrimp       PHYSICAL EXAMINATION: Vital signs: BP 138/74 mmHg  Pulse 72  Constitutional: generally well-appearing, no acute distress Psychiatric: alert and oriented x3, cooperative Eyes: extraocular movements intact, anicteric, conjunctiva pink Mouth: oral pharynx moist, no lesions Neck: supple no lymphadenopathy Cardiovascular: heart regular rate and rhythm, no murmur Lungs: clear to auscultation bilaterally Abdomen: soft, nontender, nondistended, no obvious ascites, no peritoneal signs, normal bowel sounds, no organomegaly Rectal:Deferred until colonoscopy Extremities: no lower extremity edema bilaterally Skin: no lesions on visible extremities Neuro: No focal deficits. Normal DTRs. No asterixis.  ASSESSMENT:  #1. Abdominal bloating with decreased appetite and hyperactive bowel sounds. Rule out bacterial overgrowth #2. Intermittent loose stools. Possibly postcholecystectomy #3. Problems with blood and mucus per rectum. Cause uncertain. Rule out colitis #4. Transient mild elevation of ALT. Resolved. Being monitored by PCP #5. Health related anxiety   PLAN:  #1. Prescribe Xifaxan 550 mg twice daily, for 2 weeks, for possible bacterial overgrowth #2. Schedule colonoscopy to evaluate change in bowel habits as well as problems with blood and mucus per rectum. The nature of the procedure, as well as the risks, benefits, and alternatives were carefully and thoroughly reviewed with the patient. Ample time for discussion and  questions allowed. The patient  understood, was satisfied, and agreed to proceed. #3. If workup negative, reassurance and tincture of time

## 2016-01-07 NOTE — Patient Instructions (Signed)
We have sent the following medications to your pharmacy for you to pick up at your convenience:  Xifaxan  You have been scheduled for a colonoscopy. Please follow written instructions given to you at your visit today.  Please pick up your prep supplies at the pharmacy within the next 1-3 days. If you use inhalers (even only as needed), please bring them with you on the day of your procedure. Your physician has requested that you go to www.startemmi.com and enter the access code given to you at your visit today. This web site gives a general overview about your procedure. However, you should still follow specific instructions given to you by our office regarding your preparation for the procedure.

## 2016-01-27 ENCOUNTER — Other Ambulatory Visit (INDEPENDENT_AMBULATORY_CARE_PROVIDER_SITE_OTHER): Payer: 59

## 2016-01-27 ENCOUNTER — Other Ambulatory Visit: Payer: Self-pay | Admitting: Internal Medicine

## 2016-01-27 ENCOUNTER — Ambulatory Visit (AMBULATORY_SURGERY_CENTER): Payer: 59 | Admitting: Internal Medicine

## 2016-01-27 ENCOUNTER — Encounter: Payer: Self-pay | Admitting: Internal Medicine

## 2016-01-27 VITALS — BP 121/67 | HR 69 | Temp 98.7°F | Resp 12 | Ht 67.0 in | Wt 154.0 lb

## 2016-01-27 DIAGNOSIS — K625 Hemorrhage of anus and rectum: Secondary | ICD-10-CM

## 2016-01-27 DIAGNOSIS — R140 Abdominal distension (gaseous): Secondary | ICD-10-CM

## 2016-01-27 DIAGNOSIS — R194 Change in bowel habit: Secondary | ICD-10-CM | POA: Diagnosis not present

## 2016-01-27 LAB — IGA: IGA: 143 mg/dL (ref 68–378)

## 2016-01-27 MED ORDER — SODIUM CHLORIDE 0.9 % IV SOLN: 500.0000 mL | INTRAVENOUS | Status: DC

## 2016-01-27 NOTE — Patient Instructions (Signed)
Hemorrhoids seen today, handouts given on hemorrhoids and gas prevention diet. Repeat colonoscopy in 10 years.  Patient for blood work today before discharge. Okay to try antiacid OTC for burning discomfort.  Call us with any questions or concerns. Thank you!  YOU HAD AN ENDOSCOPIC PROCEDURE TODAY AT THE [facility]:   Refer to the procedure report that was given to you for any specific questions about what was found during the examination.  If the procedure report does not answer your questions, please call your gastroenterologist to clarify.  If you requested that your care partner not be given the details of your procedure findings, then the procedure report has been included in a sealed envelope for you to review at your convenience later.  YOU SHOULD EXPECT: Some feelings of bloating in the abdomen. Passage of more gas than usual.  Walking can help get rid of the air that was put into your GI tract during the procedure and reduce the bloating. If you had a lower endoscopy (such as a colonoscopy or flexible sigmoidoscopy) you may notice spotting of blood in your stool or on the toilet paper. If you underwent a bowel prep for your procedure, you may not have a normal bowel movement for a few days.  Please Note:  You might notice some irritation and congestion in your nose or some drainage.  This is from the oxygen used during your procedure.  There is no need for concern and it should clear up in a day or so.  SYMPTOMS TO REPORT IMMEDIATELY:   Following lower endoscopy (colonoscopy or flexible sigmoidoscopy):  Excessive amounts of blood in the stool  Significant tenderness or worsening of abdominal pains  Swelling of the abdomen that is new, acute  Fever of 100F or higher   For urgent or emergent issues, a gastroenterologist can be reached at any hour by calling [phone].   DIET: Your first meal following the procedure should be a small meal and then it is ok to  progress to your normal diet. Heavy or fried foods are harder to digest and may make you feel nauseous or bloated.  Likewise, meals heavy in dairy and vegetables can increase bloating.  Drink plenty of fluids but you should avoid alcoholic beverages for 24 hours.  ACTIVITY:  You should plan to take it easy for the rest of today and you should NOT DRIVE or use heavy machinery until tomorrow (because of the sedation medicines used during the test).    FOLLOW UP: Our staff will call the number listed on your records the next business day following your procedure to check on you and address any questions or concerns that you may have regarding the information given to you following your procedure. If we do not reach you, we will leave a message.  However, if you are feeling well and you are not experiencing any problems, there is no need to return our call.  We will assume that you have returned to your regular daily activities without incident.  If any biopsies were taken you will be contacted by phone or by letter within the next 1-3 weeks.  Please call us at [phone] if you have not heard about the biopsies in 3 weeks.    SIGNATURES/CONFIDENTIALITY: You and/or your care partner have signed paperwork which will be entered into your electronic medical record.  These signatures attest to the fact that that the information above on your After Visit Summary has been reviewed and  is understood.  Full responsibility of the confidentiality of this discharge information lies with you and/or your care-partner.

## 2016-01-27 NOTE — Op Note (Signed)
[facility] Patient Name: Jeremiah Baker Procedure Date: 01/27/2016 1:49 PM MRN: [id number] Endoscopist: Wilhemina BonitoJohn N. Marina GoodellPerry , MD Age: 40 Referring MD:  Date of Birth: 03/17/1976 Gender: Male Procedure:                Colonoscopy Indications:              Generalized abdominal pain, Rectal bleeding, Change                            in bowel habits, bloating Medicines:                Monitored Anesthesia Care Procedure:                Pre-Anesthesia Assessment:                           - Prior to the procedure, a History and Physical                            was performed, and patient medications and                            allergies were reviewed. The patient's tolerance of                            previous anesthesia was also reviewed. The risks                            and benefits of the procedure and the sedation                            options and risks were discussed with the patient.                            All questions were answered, and informed consent                            was obtained. Prior Anticoagulants: The patient has                            taken no previous anticoagulant or antiplatelet                            agents. ASA Grade Assessment: I - A normal, healthy                            patient. After reviewing the risks and benefits,                            the patient was deemed in satisfactory condition to                            undergo the procedure.  After obtaining informed consent, the colonoscope                            was passed under direct vision. Throughout the                            procedure, the patient's blood pressure, pulse, and                            oxygen saturations were monitored continuously. The                            Model CF-HQ190L [id number]) scope was introduced                            through the anus and advanced to the the cecum,         identified by appendiceal orifice and ileocecal                            valve. The colonoscopy was performed without                            difficulty. The patient tolerated the procedure                            well. The quality of the bowel preparation was                            excellent. The bowel preparation used was SUPREP.                            The terminal ileum, ileocecal valve, appendiceal                            orifice, and rectum were photographed. Scope In: 2:00:52 PM Scope Out: 2:12:52 PM Scope Withdrawal Time: 0 hours 8 minutes 56 seconds  Total Procedure Duration: 0 hours 12 minutes 0 seconds  Findings:      The terminal ileum appeared normal.      The colon (entire examined portion) appeared normal.      Internal hemorrhoids were found during retroflexion. Complications:            No immediate complications. Estimated Blood Loss:     Estimated blood loss: none. Impression:               - The examined portion of the ileum was normal.                           - The entire examined colon is normal.                           - Internal hemorrhoids.                           - No specimens collected. Recommendation:           -  Patient has a contact number available for                            emergencies. The signs and symptoms of potential                            delayed complications were discussed with the                            patient. Return to normal activities tomorrow.                            Written discharge instructions were provided to the                            patient.                           - Resume previous diet. Also provided are brochure                            on intestinal gas as well as gas prevention diet.                           - Continue present medications. Okay to try                            antiacid for burning discomfort.                           - Repeat colonoscopy in 10 years for  screening                            purposes.                           - Please sent to laboratory for tissue                            transglutaminase antibody IgA (screening for celiac) Procedure Code(s):        --- Professional ---                           [phone], Colonoscopy, flexible; diagnostic, including                            collection of specimen(s) by brushing or washing,                            when performed (separate procedure) CPT copyright 2016 [employer]. All rights reserved. Wilhemina Bonito. Marina Goodell, MD 01/27/2016 2:23:56 PM This report has been signed electronically. Number of Addenda: 0 CC Letter to:             Dibas Docia Chuck, MD Referring MD:      Lucita Lora. Shuford

## 2016-01-27 NOTE — Progress Notes (Signed)
Sinus rhythm with frequent pvcs noted as baseline ekg. Pt without symptoms, VSS

## 2016-01-27 NOTE — Progress Notes (Signed)
To recovery, report to Myrers, RN, VSS 

## 2016-01-28 ENCOUNTER — Telehealth: Payer: Self-pay | Admitting: *Deleted

## 2016-01-28 LAB — TISSUE TRANSGLUTAMINASE, IGA: TISSUE TRANSGLUTAMINASE AB, IGA: 1 U/mL (ref <4)

## 2016-01-28 NOTE — Telephone Encounter (Signed)
  Follow up Call-  Call back number 01/27/2016 05/31/2015  Post procedure Call Back phone  # [phone] (M)  Permission to leave phone message Yes Yes     Patient questions:  Do you have a fever, pain , or abdominal swelling? No. Pain Score  0 *  Have you tolerated food without any problems? Yes.    Have you been able to return to your normal activities? Yes.    Do you have any questions about your discharge instructions: Diet   No. Medications  No. Follow up visit  No.  Do you have questions or concerns about your Care? No.  Actions: * If pain score is 4 or above: No action needed, pain <4.

## 2017-02-24 DIAGNOSIS — R7301 Impaired fasting glucose: Secondary | ICD-10-CM | POA: Diagnosis not present

## 2017-03-16 DIAGNOSIS — H35713 Central serous chorioretinopathy, bilateral: Secondary | ICD-10-CM | POA: Diagnosis not present

## 2017-03-18 DIAGNOSIS — H35713 Central serous chorioretinopathy, bilateral: Secondary | ICD-10-CM | POA: Diagnosis not present

## 2017-04-06 DIAGNOSIS — H35713 Central serous chorioretinopathy, bilateral: Secondary | ICD-10-CM | POA: Diagnosis not present

## 2017-04-19 DIAGNOSIS — R002 Palpitations: Secondary | ICD-10-CM | POA: Diagnosis not present

## 2017-04-19 DIAGNOSIS — Z0189 Encounter for other specified special examinations: Secondary | ICD-10-CM | POA: Diagnosis not present

## 2017-04-19 DIAGNOSIS — I493 Ventricular premature depolarization: Secondary | ICD-10-CM | POA: Diagnosis not present

## 2017-04-21 DIAGNOSIS — R002 Palpitations: Secondary | ICD-10-CM | POA: Diagnosis not present

## 2017-04-21 DIAGNOSIS — I493 Ventricular premature depolarization: Secondary | ICD-10-CM | POA: Diagnosis not present

## 2017-04-26 DIAGNOSIS — I493 Ventricular premature depolarization: Secondary | ICD-10-CM | POA: Diagnosis not present

## 2017-05-11 DIAGNOSIS — R002 Palpitations: Secondary | ICD-10-CM | POA: Diagnosis not present

## 2017-05-11 DIAGNOSIS — Z0189 Encounter for other specified special examinations: Secondary | ICD-10-CM | POA: Diagnosis not present

## 2017-05-11 DIAGNOSIS — I493 Ventricular premature depolarization: Secondary | ICD-10-CM | POA: Diagnosis not present

## 2017-05-27 DIAGNOSIS — H35713 Central serous chorioretinopathy, bilateral: Secondary | ICD-10-CM | POA: Diagnosis not present

## 2017-05-27 DIAGNOSIS — H35412 Lattice degeneration of retina, left eye: Secondary | ICD-10-CM | POA: Diagnosis not present

## 2017-05-27 DIAGNOSIS — H578 Other specified disorders of eye and adnexa: Secondary | ICD-10-CM | POA: Diagnosis not present

## 2017-06-11 DIAGNOSIS — H35713 Central serous chorioretinopathy, bilateral: Secondary | ICD-10-CM | POA: Diagnosis not present

## 2017-06-11 DIAGNOSIS — H5319 Other subjective visual disturbances: Secondary | ICD-10-CM | POA: Diagnosis not present

## 2017-06-11 DIAGNOSIS — H578 Other specified disorders of eye and adnexa: Secondary | ICD-10-CM | POA: Diagnosis not present

## 2017-06-21 DIAGNOSIS — H353231 Exudative age-related macular degeneration, bilateral, with active choroidal neovascularization: Secondary | ICD-10-CM | POA: Diagnosis not present

## 2017-06-21 DIAGNOSIS — H35713 Central serous chorioretinopathy, bilateral: Secondary | ICD-10-CM | POA: Diagnosis not present

## 2017-07-15 DIAGNOSIS — D72819 Decreased white blood cell count, unspecified: Secondary | ICD-10-CM | POA: Diagnosis not present

## 2017-07-15 DIAGNOSIS — Z0000 Encounter for general adult medical examination without abnormal findings: Secondary | ICD-10-CM | POA: Diagnosis not present

## 2017-07-15 DIAGNOSIS — R7301 Impaired fasting glucose: Secondary | ICD-10-CM | POA: Diagnosis not present

## 2017-07-16 DIAGNOSIS — Z0000 Encounter for general adult medical examination without abnormal findings: Secondary | ICD-10-CM | POA: Diagnosis not present

## 2017-07-22 DIAGNOSIS — Z23 Encounter for immunization: Secondary | ICD-10-CM | POA: Diagnosis not present

## 2017-07-23 DIAGNOSIS — D72819 Decreased white blood cell count, unspecified: Secondary | ICD-10-CM | POA: Diagnosis not present

## 2017-08-05 DIAGNOSIS — H353231 Exudative age-related macular degeneration, bilateral, with active choroidal neovascularization: Secondary | ICD-10-CM | POA: Diagnosis not present

## 2017-09-28 DIAGNOSIS — D72819 Decreased white blood cell count, unspecified: Secondary | ICD-10-CM | POA: Diagnosis not present

## 2017-10-01 DIAGNOSIS — H353231 Exudative age-related macular degeneration, bilateral, with active choroidal neovascularization: Secondary | ICD-10-CM | POA: Diagnosis not present

## 2017-10-01 DIAGNOSIS — H35713 Central serous chorioretinopathy, bilateral: Secondary | ICD-10-CM | POA: Diagnosis not present

## 2017-12-03 DIAGNOSIS — H35713 Central serous chorioretinopathy, bilateral: Secondary | ICD-10-CM | POA: Diagnosis not present

## 2017-12-03 DIAGNOSIS — H353232 Exudative age-related macular degeneration, bilateral, with inactive choroidal neovascularization: Secondary | ICD-10-CM | POA: Diagnosis not present

## 2017-12-29 DIAGNOSIS — R002 Palpitations: Secondary | ICD-10-CM | POA: Diagnosis not present

## 2017-12-29 DIAGNOSIS — R0789 Other chest pain: Secondary | ICD-10-CM | POA: Diagnosis not present

## 2017-12-29 DIAGNOSIS — I493 Ventricular premature depolarization: Secondary | ICD-10-CM | POA: Diagnosis not present

## 2018-02-21 DIAGNOSIS — R399 Unspecified symptoms and signs involving the genitourinary system: Secondary | ICD-10-CM | POA: Diagnosis not present

## 2018-03-18 DIAGNOSIS — H35713 Central serous chorioretinopathy, bilateral: Secondary | ICD-10-CM | POA: Diagnosis not present

## 2018-03-18 DIAGNOSIS — H353232 Exudative age-related macular degeneration, bilateral, with inactive choroidal neovascularization: Secondary | ICD-10-CM | POA: Diagnosis not present

## 2018-03-18 DIAGNOSIS — H35412 Lattice degeneration of retina, left eye: Secondary | ICD-10-CM | POA: Diagnosis not present

## 2018-04-27 DIAGNOSIS — L989 Disorder of the skin and subcutaneous tissue, unspecified: Secondary | ICD-10-CM | POA: Diagnosis not present

## 2018-07-14 DIAGNOSIS — Z23 Encounter for immunization: Secondary | ICD-10-CM | POA: Diagnosis not present

## 2018-07-22 DIAGNOSIS — H35713 Central serous chorioretinopathy, bilateral: Secondary | ICD-10-CM | POA: Diagnosis not present

## 2018-07-22 DIAGNOSIS — H35412 Lattice degeneration of retina, left eye: Secondary | ICD-10-CM | POA: Diagnosis not present

## 2018-07-22 DIAGNOSIS — H353232 Exudative age-related macular degeneration, bilateral, with inactive choroidal neovascularization: Secondary | ICD-10-CM | POA: Diagnosis not present

## 2018-08-26 DIAGNOSIS — R0789 Other chest pain: Secondary | ICD-10-CM | POA: Diagnosis not present

## 2018-08-26 DIAGNOSIS — Z0000 Encounter for general adult medical examination without abnormal findings: Secondary | ICD-10-CM | POA: Diagnosis not present

## 2018-08-29 DIAGNOSIS — Z0000 Encounter for general adult medical examination without abnormal findings: Secondary | ICD-10-CM | POA: Diagnosis not present

## 2018-08-29 DIAGNOSIS — Z136 Encounter for screening for cardiovascular disorders: Secondary | ICD-10-CM | POA: Diagnosis not present

## 2018-10-24 ENCOUNTER — Emergency Department (HOSPITAL_COMMUNITY)
Admission: EM | Admit: 2018-10-24 | Discharge: 2018-10-24 | Disposition: A | Discharge status: Home / Self Care | Payer: 59 | Attending: Emergency Medicine | Admitting: Emergency Medicine

## 2018-10-24 ENCOUNTER — Encounter (HOSPITAL_COMMUNITY): Payer: Self-pay | Admitting: *Deleted

## 2018-10-24 DIAGNOSIS — F419 Anxiety disorder, unspecified: Secondary | ICD-10-CM | POA: Insufficient documentation

## 2018-10-24 DIAGNOSIS — Z5321 Procedure and treatment not carried out due to patient leaving prior to being seen by health care provider: Secondary | ICD-10-CM | POA: Diagnosis not present

## 2018-10-24 DIAGNOSIS — R42 Dizziness and giddiness: Secondary | ICD-10-CM | POA: Diagnosis not present

## 2018-10-24 NOTE — ED Triage Notes (Signed)
Pt reports dizziness and anxiety since last night. Pt states has had indigestion and does not have an appetite.

## 2021-02-01 ENCOUNTER — Other Ambulatory Visit: Payer: Self-pay

## 2021-02-01 ENCOUNTER — Encounter (HOSPITAL_COMMUNITY): Payer: Self-pay

## 2021-02-01 ENCOUNTER — Emergency Department (HOSPITAL_COMMUNITY)
Admission: EM | Admit: 2021-02-01 | Discharge: 2021-02-01 | Disposition: A | Discharge status: Home / Self Care | Payer: 59 | Attending: Emergency Medicine | Admitting: Emergency Medicine

## 2021-02-01 ENCOUNTER — Emergency Department (HOSPITAL_COMMUNITY): Payer: 59

## 2021-02-01 DIAGNOSIS — S60122A Contusion of left index finger with damage to nail, initial encounter: Secondary | ICD-10-CM | POA: Insufficient documentation

## 2021-02-01 DIAGNOSIS — W231XXA Caught, crushed, jammed, or pinched between stationary objects, initial encounter: Secondary | ICD-10-CM | POA: Insufficient documentation

## 2021-02-01 DIAGNOSIS — S6010XA Contusion of unspecified finger with damage to nail, initial encounter: Secondary | ICD-10-CM

## 2021-02-01 DIAGNOSIS — S67191A Crushing injury of left index finger, initial encounter: Secondary | ICD-10-CM | POA: Diagnosis present

## 2021-02-01 NOTE — ED Triage Notes (Signed)
Pt presents with c/o right index finger injury. Pt reports that his finger was slammed into a car door yesterday. Pt's nail bed is purple in color, some swelling noted.

## 2021-02-01 NOTE — Discharge Instructions (Addendum)
I recommend a combination of tylenol and ibuprofen for management of your pain. You can take a low dose of both at the same time. I recommend 500 mg of Tylenol combined with 600 mg of ibuprofen. This is one maximum strength Tylenol and three regular ibuprofen. You can take these 2-3 times for day for your pain. Please try to take these medications with a small amount of food as well to prevent upsetting your stomach.  Please continue to ice the finger and elevate the hand.  Let blood continue to drain from underneath the fingernail.  This will help provide pain relief and reduce pressure in the finger.  If your symptoms worsen, you can always return to the emergency department.  Otherwise, please follow-up with your regular doctor.  It was a pleasure to meet you.

## 2021-02-01 NOTE — ED Provider Notes (Signed)
[facility]-EMERGENCY DEPT Provider Note   CSN: [id number] Arrival date & time: 02/01/21  0849     History Chief Complaint  Patient presents with  . Finger Injury    JADARIUS COMMONS is a 45 y.o. male.  HPI Patient is a 45 year old male who presents the emergency department with left index finger pain.  He states last night his child accidentally slammed a car door on the finger.  He reports pain, swelling, and bruising circumferentially around the distal tip of the left finger.  Pain worsens with palpation.  He has not taken anything for his symptoms.  Denies numbness, tingling, weakness.    Past Medical History:  Diagnosis Date  . Kidney stones     Patient Active Problem List   Diagnosis Date Noted  . Chronic cholecystitis with calculus s/p lap chole 07/24/2015 07/25/2015  . Right elbow pain 11/01/2014    Past Surgical History:  Procedure Laterality Date  . LAPAROSCOPIC CHOLECYSTECTOMY SINGLE SITE WITH INTRAOPERATIVE CHOLANGIOGRAM N/A 07/25/2015   Procedure: LAPAROSCOPIC CHOLECYSTECTOMY SINGLE SITE WITH INTRAOPERATIVE CHOLANGIOGRAM;  Surgeon: Karie Soda, MD;  Location: [facility]S;  Service: General;  Laterality: N/A;  . WISDOM TOOTH EXTRACTION         Family History  Problem Relation Age of Onset  . Non-Hodgkin's lymphoma Father   . Hypertension Mother   . Colon polyps Mother   . Colon cancer Neg Hx   . Esophageal cancer Neg Hx   . Stomach cancer Neg Hx   . Rectal cancer Neg Hx     Social History   Tobacco Use  . Smoking status: Never Smoker  . Smokeless tobacco: Never Used  Substance Use Topics  . Alcohol use: No    Alcohol/week: 0.0 standard drinks  . Drug use: No    Home Medications Prior to Admission medications   Medication Sig Start Date End Date Taking? Authorizing Provider  rifaximin (XIFAXAN) 550 MG TABS tablet Take 1 tablet (550 mg total) by mouth 3 (three) times daily. Patient not taking: Reported on 01/27/2016 01/07/16    Hilarie Fredrickson, MD    Allergies    Shellfish allergy  Review of Systems   Review of Systems  Musculoskeletal: Positive for arthralgias, joint swelling and myalgias.  Skin: Negative for wound.  Neurological: Negative for weakness and numbness.   Physical Exam Updated Vital Signs BP (!) 149/94 (BP Location: Right Arm)   Pulse 94   Temp 97.8 F (36.6 C) (Oral)   Resp 18   SpO2 100%   Physical Exam Vitals and nursing note reviewed.  Constitutional:      General: He is not in acute distress.    Appearance: He is well-developed.  HENT:     Head: Normocephalic and atraumatic.     Right Ear: External ear normal.     Left Ear: External ear normal.  Eyes:     General: No scleral icterus.       Right eye: No discharge.        Left eye: No discharge.     Conjunctiva/sclera: Conjunctivae normal.  Neck:     Trachea: No tracheal deviation.  Cardiovascular:     Rate and Rhythm: Normal rate.  Pulmonary:     Effort: Pulmonary effort is normal. No respiratory distress.     Breath sounds: No stridor.  Abdominal:     General: There is no distension.  Musculoskeletal:        General: Swelling and tenderness present. No deformity.  Cervical back: Neck supple.     Comments: Left index finger: Subungual hematoma noted diffusely along the nailbed.  Mild circumferential tenderness and swelling noted to the distal phalanx of the left index finger.  Decreased range of motion secondary to swelling.  Distal sensation intact.  Good cap refill.  Skin:    General: Skin is warm and dry.     Findings: Bruising present. No rash.  Neurological:     Mental Status: He is alert.     Cranial Nerves: Cranial nerve deficit: no gross deficits.    ED Results / Procedures / Treatments   Labs (all labs ordered are listed, but only abnormal results are displayed) Labs Reviewed - No data to display  EKG None  Radiology DG Hand Complete Left  Result Date: 02/01/2021 CLINICAL DATA:  Crush injury to  the LEFT index finger yesterday when closed in a car door. Initial encounter. EXAM: LEFT HAND - COMPLETE 3+ VIEW COMPARISON:  None. FINDINGS: Mild soft tissue swelling involving the distal portion of the index finger. No evidence of acute fracture or dislocation. Joint spaces well preserved. Well-preserved bone mineral density. No intrinsic osseous abnormalities. IMPRESSION: No osseous abnormality. Electronically Signed   By: Hulan Saas M.D.   On: 02/01/2021 10:35    Procedures Procedures   Medications Ordered in ED Medications - No data to display  ED Course  I have reviewed the triage vital signs and the nursing notes.  Pertinent labs & imaging results that were available during my care of the patient were reviewed by me and considered in my medical decision making (see chart for details).    MDM Rules/Calculators/A&P                          Pt is a 45 y.o. male who presents to the ED with left index finger pain.  Imaging: X-ray of the left hand was negative.  I, Placido Sou, PA-C, personally reviewed and evaluated these images and lab results as part of my medical decision-making.  Patient had reassuring imaging of the left hand.  There was an obvious subungual hematoma with diffuse swelling at the distal tip of the left index finger.  I perform trephination on the finger appreciating about 1 to 2 cc of blood.  Patient notes moderate relief of his symptoms.  Recommended icing the finger and elevating the finger.  Tylenol and ibuprofen for management of his pain.  We discussed dosing.  Feel he is stable for discharge and he is agreeable.  His questions were answered he was amicable at the time of discharge.  Note: Portions of this report may have been transcribed using voice recognition software. Every effort was made to ensure accuracy; however, inadvertent computerized transcription errors may be present.   Final Clinical Impression(s) / ED Diagnoses Final diagnoses:   Subungual hematoma of digit of hand, initial encounter   Rx / DC Orders ED Discharge Orders    None       Placido Sou, PA-C 02/01/21 1107    Benjiman Core, MD 02/01/21 1514

## 2022-01-04 IMAGING — DX DG HAND COMPLETE 3+V*L*
3 series · 3 of 3 positions shown · non-contrast
Comparison: None.

CLINICAL DATA: Crush injury to the LEFT index finger yesterday when
closed in a car door. Initial encounter.

EXAM:
LEFT HAND - COMPLETE 3+ VIEW

[hand ap]
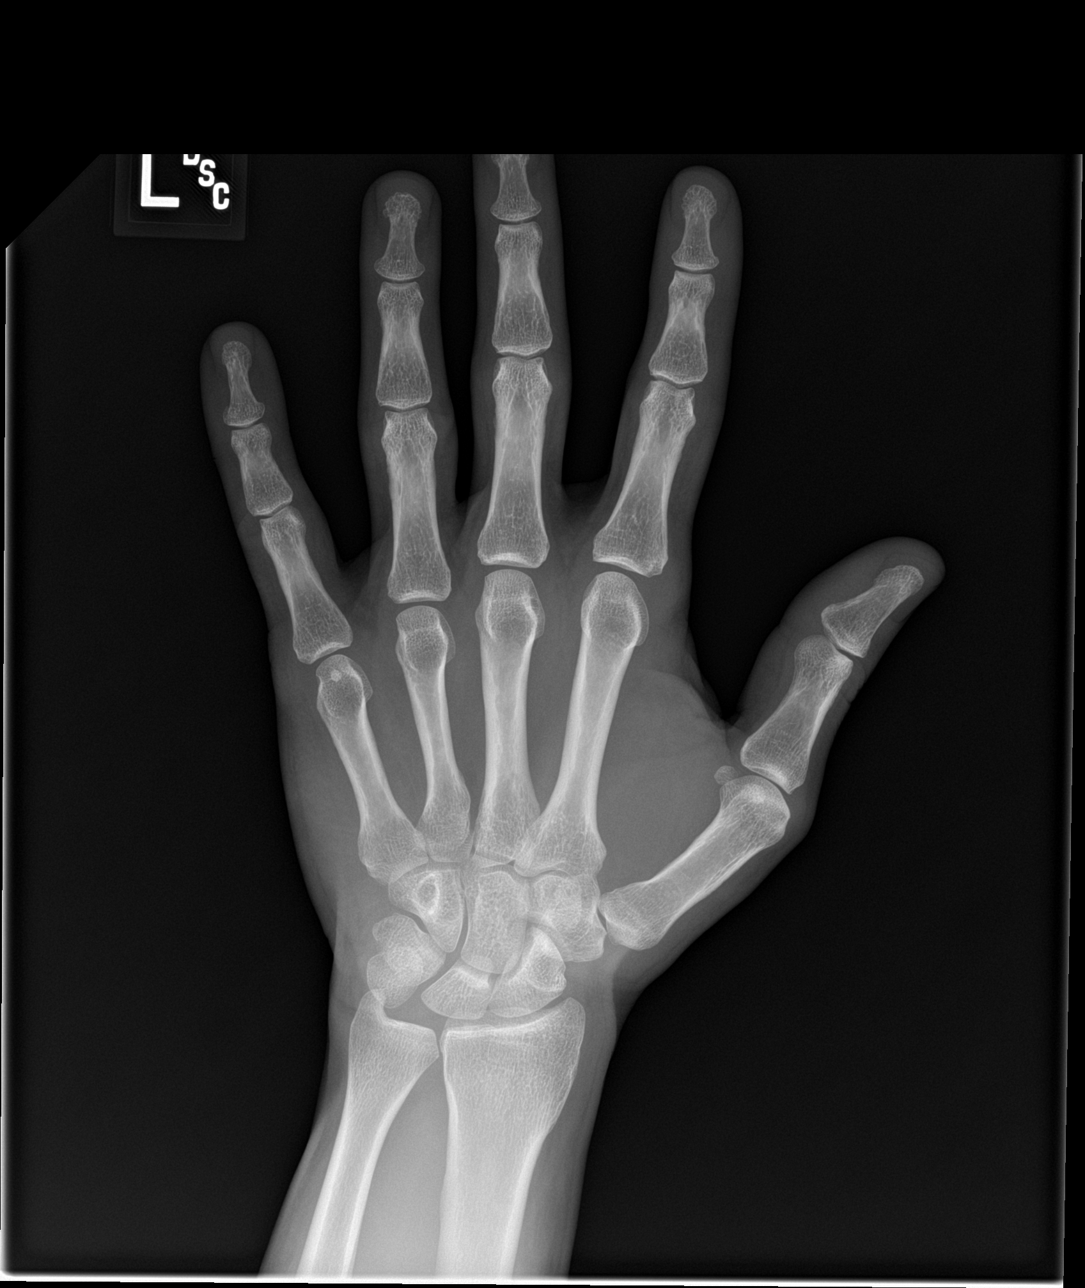

[hand obl]
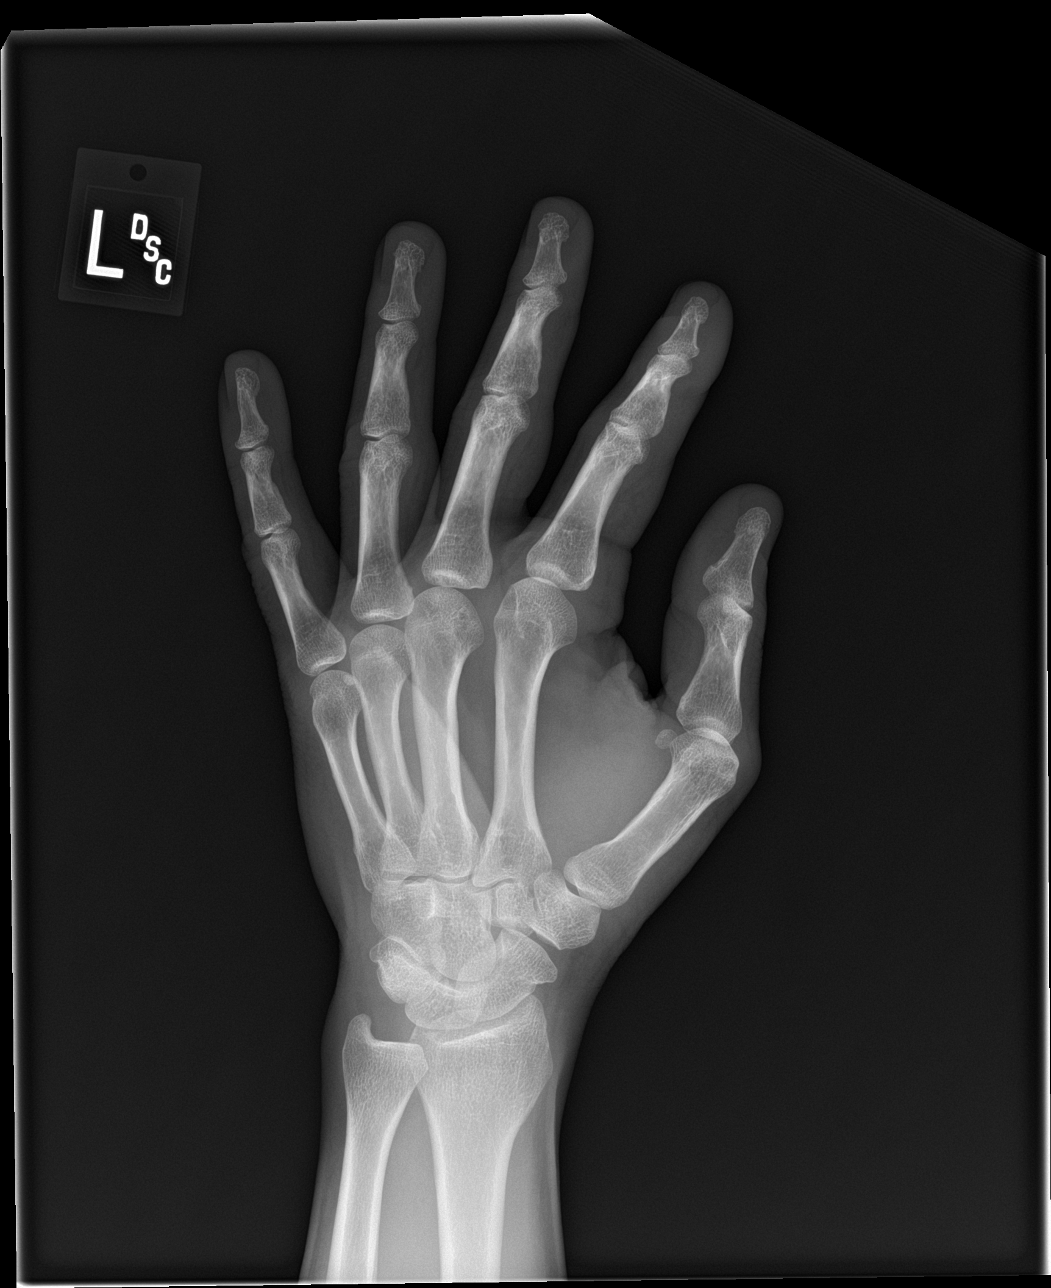

[hand lat]
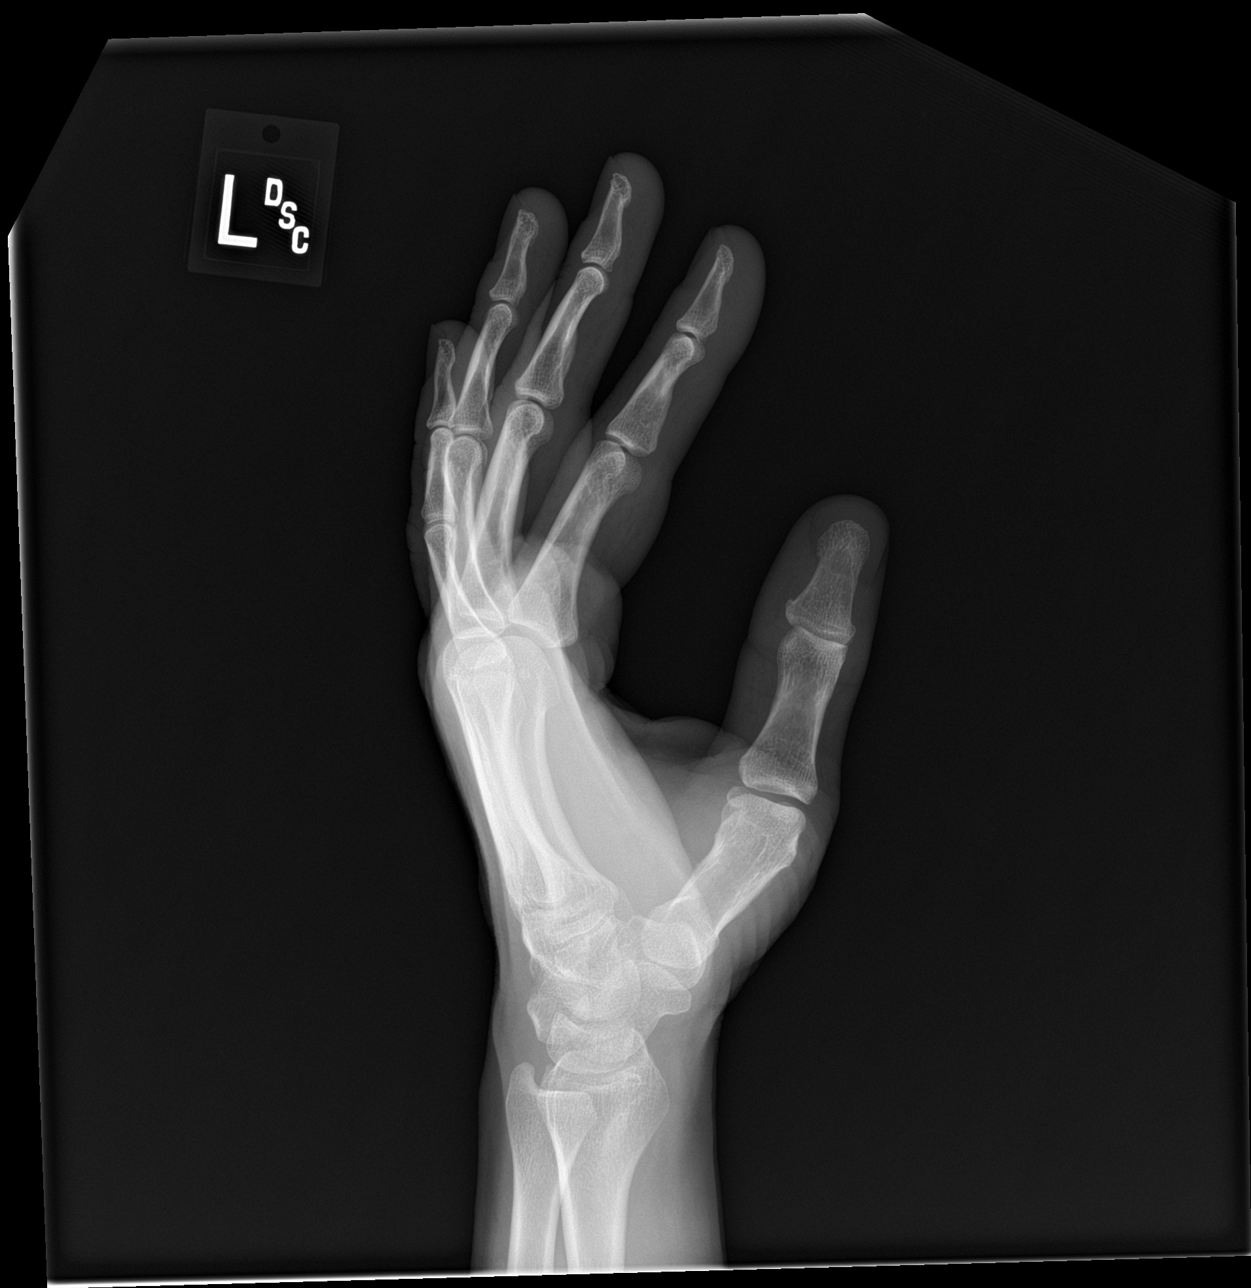

[3 of 3 positions shown; findings below may reference images not displayed]

FINDINGS: Mild soft tissue swelling involving the distal portion of the index
finger. No evidence of acute fracture or dislocation. Joint spaces
well preserved. Well-preserved bone mineral density. No intrinsic
osseous abnormalities.
IMPRESSION: No osseous abnormality.

## 2022-11-17 ENCOUNTER — Encounter: Payer: Self-pay | Admitting: Internal Medicine

## 2022-12-21 ENCOUNTER — Ambulatory Visit: Payer: 59 | Admitting: Internal Medicine

## 2022-12-21 ENCOUNTER — Encounter: Payer: Self-pay | Admitting: Internal Medicine

## 2022-12-21 VITALS — BP 122/88 | HR 77 | Ht 67.0 in | Wt 168.0 lb

## 2022-12-21 DIAGNOSIS — K604 Rectal fistula: Secondary | ICD-10-CM | POA: Diagnosis not present

## 2022-12-21 MED ORDER — NA SULFATE-K SULFATE-MG SULF 17.5-3.13-1.6 GM/177ML PO SOLN: 1.0000 | Freq: Once | ORAL | 0 refills | Status: AC

## 2022-12-21 MED ORDER — CIPROFLOXACIN HCL 500 MG PO TABS: 500.0000 mg | ORAL_TABLET | Freq: Two times a day (BID) | ORAL | 0 refills | Status: DC

## 2022-12-21 MED ORDER — NA SULFATE-K SULFATE-MG SULF 17.5-3.13-1.6 GM/177ML PO SOLN: 1.0000 | Freq: Once | ORAL | 0 refills | Status: DC

## 2022-12-21 MED ORDER — METRONIDAZOLE 500 MG PO TABS: 500.0000 mg | ORAL_TABLET | Freq: Two times a day (BID) | ORAL | 0 refills | Status: DC

## 2022-12-21 NOTE — Patient Instructions (Signed)
_______________________________________________________  If your blood pressure at your visit was 140/90 or greater, please contact your primary care physician to follow up on this.  _______________________________________________________  If you are age 49 or older, your body mass index should be between 23-30. Your Body mass index is 26.31 kg/m. If this is out of the aforementioned range listed, please consider follow up with your Primary Care Provider.  If you are age 65 or younger, your body mass index should be between 19-25. Your Body mass index is 26.31 kg/m. If this is out of the aformentioned range listed, please consider follow up with your Primary Care Provider.   ________________________________________________________  The [facility] providers would like to encourage you to use [facility] to communicate with providers for non-urgent requests or questions.  Due to long hold times on the telephone, sending your provider a message by [facility] may be a faster and more efficient way to get a response.  Please allow 48 business hours for a response.  Please remember that this is for non-urgent requests.  _______________________________________________________  We have sent the following medications to your pharmacy for you to pick up at your convenience:  Cipro and Flagyl  You have been referred to [facility] - they will call you to schedule an appointment.  You have been scheduled for an MRI at [facility] on 01/01/2023. Your appointment time is 10:00am. Please arrive to admitting (at main entrance of the hospital) 30 minutes prior to your appointment time for registration purposes. if you have any metal in your body, have a pacemaker or defibrillator, please be sure to let your ordering physician know. This test typically takes 45 minutes to 1 hour to complete. Should you need to reschedule, please call [phone] to do so.   You have been scheduled for a colonoscopy. Please follow written  instructions given to you at your visit today.  Please pick up your prep supplies at the pharmacy within the next 1-3 days. If you use inhalers (even only as needed), please bring them with you on the day of your procedure.

## 2022-12-21 NOTE — Progress Notes (Signed)
HISTORY OF PRESENT ILLNESS:  Jeremiah Baker is a 47 y.o. male with no significant past history who presents today regarding possible "hemorrhoids".  I last saw the patient in the office March 2017 regarding abdominal bloating, intermittent loose stools, and reports of bloody mucus per rectum.  See that dictation.  He was subsequently set up for colonoscopy January 27, 2016.  The examination, including intubation of the terminal ileum, was normal.  Internal hemorrhoids noted.  Patient states that his current problem began approximately 1 year ago.  He describes perirectal discomfort with intermittent drainage of mucopurulent material.  Occasionally blood-tinged.  He shows me pictures on his mobile phone.  He has been using a myriad of topical ointments and creams.  He shows me a picture of these various agents.  Due to the persistent nature of this problem, he scheduled this appointment.  His GI review of systems is otherwise negative.  Remote upper endoscopy in 2016 was normal.  He is status post cholecystectomy.  REVIEW OF SYSTEMS:  All non-GI ROS negative entirely Past Medical History:  Diagnosis Date   Kidney stones     Past Surgical History:  Procedure Laterality Date   LAPAROSCOPIC CHOLECYSTECTOMY SINGLE SITE WITH INTRAOPERATIVE CHOLANGIOGRAM N/A 07/25/2015   Procedure: LAPAROSCOPIC CHOLECYSTECTOMY SINGLE SITE WITH INTRAOPERATIVE CHOLANGIOGRAM;  Surgeon: Michael Boston, MD;  Location: [facility];  Service: General;  Laterality: N/A;   WISDOM TOOTH EXTRACTION      Social History Dierdre Searles  reports that he has never smoked. He has never used smokeless tobacco. He reports that he does not drink alcohol and does not use drugs.  family history includes Colon polyps in his mother; Hypertension in his mother; Non-Hodgkin's lymphoma in his father.  Allergies  Allergen Reactions   Shellfish Allergy Hives    Reaction to shrimp       PHYSICAL EXAMINATION: Vital signs: BP 122/88   Pulse  77   Ht '5\' 7"'$  (1.702 m)   Wt 168 lb (76.2 kg)   SpO2 98%   BMI 26.31 kg/m   Constitutional: generally well-appearing, no acute distress Psychiatric: alert and oriented x3, cooperative Eyes: extraocular movements intact, anicteric, conjunctiva pink Mouth: oral pharynx moist, no lesions Neck: supple no lymphadenopathy Cardiovascular: heart regular rate and rhythm, no murmur Lungs: clear to auscultation bilaterally Abdomen: soft, nontender, nondistended, no obvious ascites, no peritoneal signs, normal bowel sounds, no organomegaly Rectal: Mucosal nippling in the perirectal region at 5:00 consistent with fistulous tract.  Tender without drainage.  Otherwise unremarkable rectal exam Extremities: no clubbing, cyanosis, or lower extremity edema bilaterally Skin: no lesions on visible extremities Neuro: No focal deficits.  Cranial nerves intact  ASSESSMENT:  1.  Perirectal fistula.  Symptomatic with intermittent drainage over the past year. 2.  Colonoscopy 2017 was normal   PLAN:  1.  Treated with ciprofloxacin 500 mg p.o. twice daily and metronidazole 500 mg p.o. twice daily x 10 days 2.  Sitz bath 3.  Schedule colonoscopy to rule out other pathology associated with fistulous disease, such as Crohn's. 4.  MRI of the pelvis to further define or classify the fistula 5.  Refer to colorectal surgeon, Dr. Nadeen Landau.  Patient may need examination under anesthesia with fistulotomy and/or seton placement. A total time of 60 minutes was spent preparing to see the patient, reviewing images and previous data, obtaining comprehensive history, performing medically appropriate physical examination, counseling and educating the patient regarding above listed issues, ordering medication, ordering advanced radiology study, ordering colonoscopy,  and ordering general surgical referral.  Finally, documenting clinical information in the health record.

## 2023-01-01 ENCOUNTER — Ambulatory Visit (HOSPITAL_COMMUNITY)
Admission: RE | Admit: 2023-01-01 | Discharge: 2023-01-01 | Disposition: A | Discharge status: Home / Self Care | Payer: 59 | Source: Ambulatory Visit | Attending: Internal Medicine | Admitting: Internal Medicine

## 2023-01-01 DIAGNOSIS — K604 Rectal fistula: Secondary | ICD-10-CM | POA: Insufficient documentation

## 2023-01-01 MED ORDER — GADOBUTROL 1 MMOL/ML IV SOLN
8.0000 mL | Freq: Once | INTRAVENOUS | Status: AC | PRN
  Administered 2023-01-01: 8 mL via INTRAVENOUS

## 2023-01-01 NOTE — Progress Notes (Signed)
Pt came to MRI dept for out patient exam. Pt received contrast for MRI. Pt did not voice discomfort post contrast. Once exam was finished, pt was taken to dressing room. Upon exiting dressing room, Pt states he had hives. Pt displayed hives on neck and chest. Radiologist Dr. Leonia Reeves came to assess pt. Dr. Leonia Reeves states pt was able to leave. Pt verbally states he was going to take a benadryl when he left. Pt aware to contact [facility] if he had any concerns.

## 2023-01-25 ENCOUNTER — Encounter: Payer: Self-pay | Admitting: Internal Medicine

## 2023-01-25 ENCOUNTER — Ambulatory Visit (AMBULATORY_SURGERY_CENTER): Payer: 59 | Admitting: Internal Medicine

## 2023-01-25 ENCOUNTER — Telehealth: Payer: Self-pay

## 2023-01-25 VITALS — BP 136/69 | HR 75 | Temp 96.8°F | Resp 14 | Ht 67.0 in | Wt 168.0 lb

## 2023-01-25 DIAGNOSIS — K635 Polyp of colon: Secondary | ICD-10-CM

## 2023-01-25 DIAGNOSIS — D122 Benign neoplasm of ascending colon: Secondary | ICD-10-CM

## 2023-01-25 DIAGNOSIS — Z1211 Encounter for screening for malignant neoplasm of colon: Secondary | ICD-10-CM | POA: Diagnosis not present

## 2023-01-25 DIAGNOSIS — K604 Rectal fistula: Secondary | ICD-10-CM | POA: Diagnosis not present

## 2023-01-25 HISTORY — PX: COLONOSCOPY WITH PROPOFOL: SHX5780

## 2023-01-25 MED ORDER — SODIUM CHLORIDE 0.9 % IV SOLN: 500.0000 mL | INTRAVENOUS | Status: DC

## 2023-01-25 NOTE — Progress Notes (Signed)
Called to room to assist during endoscopic procedure.  Patient ID and intended procedure confirmed with present staff. Received instructions for my participation in the procedure from the performing physician.  

## 2023-01-25 NOTE — Progress Notes (Signed)
HISTORY OF PRESENT ILLNESS:   Jeremiah Baker is a 47 y.o. male with no significant past history who presents today regarding possible "hemorrhoids".  I last saw the patient in the office March 2017 regarding abdominal bloating, intermittent loose stools, and reports of bloody mucus per rectum.  See that dictation.  He was subsequently set up for colonoscopy January 27, 2016.  The examination, including intubation of the terminal ileum, was normal.  Internal hemorrhoids noted.   Patient states that his current problem began approximately 1 year ago.  He describes perirectal discomfort with intermittent drainage of mucopurulent material.  Occasionally blood-tinged.  He shows me pictures on his mobile phone.  He has been using a myriad of topical ointments and creams.  He shows me a picture of these various agents.  Due to the persistent nature of this problem, he scheduled this appointment.  His GI review of systems is otherwise negative.  Remote upper endoscopy in 2016 was normal.  He is status post cholecystectomy.   REVIEW OF SYSTEMS:   All non-GI ROS negative entirely     Past Medical History:  Diagnosis Date   Kidney stones             Past Surgical History:  Procedure Laterality Date   LAPAROSCOPIC CHOLECYSTECTOMY SINGLE SITE WITH INTRAOPERATIVE CHOLANGIOGRAM N/A 07/25/2015    Procedure: LAPAROSCOPIC CHOLECYSTECTOMY SINGLE SITE WITH INTRAOPERATIVE CHOLANGIOGRAM;  Surgeon: Michael Boston, MD;  Location: [facility];  Service: General;  Laterality: N/A;   WISDOM TOOTH EXTRACTION          Social History Dierdre Searles  reports that he has never smoked. He has never used smokeless tobacco. He reports that he does not drink alcohol and does not use drugs.   family history includes Colon polyps in his mother; Hypertension in his mother; Non-Hodgkin's lymphoma in his father.        Allergies  Allergen Reactions   Shellfish Allergy Hives      Reaction to shrimp          PHYSICAL  EXAMINATION: Vital signs: BP 122/88   Pulse 77   Ht 5\' 7"  (1.702 m)   Wt 168 lb (76.2 kg)   SpO2 98%   BMI 26.31 kg/m   Constitutional: generally well-appearing, no acute distress Psychiatric: alert and oriented x3, cooperative Eyes: extraocular movements intact, anicteric, conjunctiva pink Mouth: oral pharynx moist, no lesions Neck: supple no lymphadenopathy Cardiovascular: heart regular rate and rhythm, no murmur Lungs: clear to auscultation bilaterally Abdomen: soft, nontender, nondistended, no obvious ascites, no peritoneal signs, normal bowel sounds, no organomegaly Rectal: Mucosal nippling in the perirectal region at 5:00 consistent with fistulous tract.  Tender without drainage.  Otherwise unremarkable rectal exam Extremities: no clubbing, cyanosis, or lower extremity edema bilaterally Skin: no lesions on visible extremities Neuro: No focal deficits.  Cranial nerves intact   ASSESSMENT:   1.  Perirectal fistula.  Symptomatic with intermittent drainage over the past year. 2.  Colonoscopy 2017 was normal     PLAN:   1.  Treated with ciprofloxacin 500 mg p.o. twice daily and metronidazole 500 mg p.o. twice daily x 10 days 2.  Sitz bath 3.  Schedule colonoscopy to rule out other pathology associated with fistulous disease, such as Crohn's. 4.  MRI of the pelvis to further define or classify the fistula 5.  Refer to colorectal surgeon, Dr. Nadeen Landau.  Patient may need examination under anesthesia with fistulotomy and/or seton placement.   No interval clinical  change.  Now for colonoscopy. MRI was negative for active fistula or abscess

## 2023-01-25 NOTE — Progress Notes (Signed)
Report to PACU, RN, vss, BBS= Clear.  

## 2023-01-25 NOTE — Telephone Encounter (Signed)
Called [facility] and they stated they did not receive referral. Referral faxed to urgent referral line [phone] Attn:  Cat per her request. Dr. Henrene Pastor aware.

## 2023-01-25 NOTE — Patient Instructions (Addendum)
Handouts Provided:  Polyps  Repeat colonoscopy in 7-10 years for surveillance.  YOU HAD AN ENDOSCOPIC PROCEDURE TODAY AT [facility]:   Refer to the procedure report that was given to you for any specific questions about what was found during the examination.  If the procedure report does not answer your questions, please call your gastroenterologist to clarify.  If you requested that your care partner not be given the details of your procedure findings, then the procedure report has been included in a sealed envelope for you to review at your convenience later.  YOU SHOULD EXPECT: Some feelings of bloating in the abdomen. Passage of more gas than usual.  Walking can help get rid of the air that was put into your GI tract during the procedure and reduce the bloating. If you had a lower endoscopy (such as a colonoscopy or flexible sigmoidoscopy) you may notice spotting of blood in your stool or on the toilet paper. If you underwent a bowel prep for your procedure, you may not have a normal bowel movement for a few days.  Please Note:  You might notice some irritation and congestion in your nose or some drainage.  This is from the oxygen used during your procedure.  There is no need for concern and it should clear up in a day or so.  SYMPTOMS TO REPORT IMMEDIATELY:  Following lower endoscopy (colonoscopy or flexible sigmoidoscopy):  Excessive amounts of blood in the stool  Significant tenderness or worsening of abdominal pains  Swelling of the abdomen that is new, acute  Fever of 100F or higher  For urgent or emergent issues, a gastroenterologist can be reached at any hour by calling [phone]. Do not use MyChart messaging for urgent concerns.    DIET:  We do recommend a small meal at first, but then you may proceed to your regular diet.  Drink plenty of fluids but you should avoid alcoholic beverages for 24 hours.  ACTIVITY:  You should plan to take it easy for the rest  of today and you should NOT DRIVE or use heavy machinery until tomorrow (because of the sedation medicines used during the test).    FOLLOW UP: Our staff will call the number listed on your records the next business day following your procedure.  We will call around 7:15- 8:00 am to check on you and address any questions or concerns that you may have regarding the information given to you following your procedure. If we do not reach you, we will leave a message.     If any biopsies were taken you will be contacted by phone or by letter within the next 1-3 weeks.  Please call us at [phone] if you have not heard about the biopsies in 3 weeks.    SIGNATURES/CONFIDENTIALITY: You and/or your care partner have signed paperwork which will be entered into your electronic medical record.  These signatures attest to the fact that that the information above on your After Visit Summary has been reviewed and is understood.  Full responsibility of the confidentiality of this discharge information lies with you and/or your care-partner.

## 2023-01-25 NOTE — Progress Notes (Signed)
Pt's states no medical or surgical changes since previsit or office visit. 

## 2023-01-25 NOTE — Telephone Encounter (Signed)
-----   Message from Irene Shipper, MD sent at 01/25/2023  1:58 PM EDT ----- Regarding: Surgical appointment with Dr. Carlyn Reichert, I saw this patient in the office for perirectal fistula. I had requested a surgical evaluation with Dr. Nadeen Landau (colorectal surgeon with [facility] surgery).  I just finished the patient's colonoscopy this afternoon.  He tells me he has not been contacted by anyone regarding his surgical appointment. Please look into this.  Please make sure that somebody contact him regarding a surgical appointment with Dr. Dema Severin. Thanks, JP

## 2023-01-25 NOTE — Op Note (Signed)
[place] Patient Name: Jeremiah Baker Procedure Date: 01/25/2023 1:21 PM MRN: [id number] Endoscopist: Docia Chuck. Henrene Pastor , MD, [id number] Age: 47 Referring MD:  Date of Birth: [birth year] Gender: Male Account #: [id number] Procedure:                Colonoscopy with cold snare polypectomy x 1 Indications:              Screening for colorectal malignant neoplasm. Crypto                            glandular perianal fistula Medicines:                Monitored Anesthesia Care Procedure:                Pre-Anesthesia Assessment:                           - Prior to the procedure, a History and Physical                            was performed, and patient medications and                            allergies were reviewed. The patient's tolerance of                            previous anesthesia was also reviewed. The risks                            and benefits of the procedure and the sedation                            options and risks were discussed with the patient.                            All questions were answered, and informed consent                            was obtained. Prior Anticoagulants: The patient has                            taken no anticoagulant or antiplatelet agents.                            After reviewing the risks and benefits, the patient                            was deemed in satisfactory condition to undergo the                            procedure.                           After obtaining informed consent, the colonoscope  was passed under direct vision. Throughout the                            procedure, the patient's blood pressure, pulse, and                            oxygen saturations were monitored continuously. The                            Olympus CF-HQ190L [id number]) Colonoscope was                            introduced through the anus and advanced to the the                            cecum, identified  by appendiceal orifice and                            ileocecal valve. The terminal ileum, ileocecal                            valve, appendiceal orifice, and rectum were                            photographed. The quality of the bowel preparation                            was excellent. The colonoscopy was performed                            without difficulty. The patient tolerated the                            procedure well. The bowel preparation used was                            SUPREP via split dose instruction. Scope In: 1:30:39 PM Scope Out: 1:45:56 PM Scope Withdrawal Time: 0 hours 12 minutes 1 second  Total Procedure Duration: 0 hours 15 minutes 17 seconds  Findings:                 The terminal ileum appeared normal.                           A 4 mm polyp was found in the ascending colon. The                            polyp was removed with a cold snare. Resection and                            retrieval were complete.                           Internal hemorrhoids were found during  retroflexion. The hemorrhoids were small.                           The exam was otherwise without abnormality on                            direct and retroflexion views. Complications:            No immediate complications. Estimated blood loss:                            None. Estimated Blood Loss:     Estimated blood loss: none. Impression:               - The examined portion of the ileum was normal.                           - One 4 mm polyp in the ascending colon, removed                            with a cold snare. Resected and retrieved.                           - Internal hemorrhoids.                           - The examination was otherwise normal on direct                            and retroflexion views. Recommendation:           - Repeat colonoscopy in 7-10 years for surveillance.                           - Patient has a contact number available  for                            emergencies. The signs and symptoms of potential                            delayed complications were discussed with the                            patient. Return to normal activities tomorrow.                            Written discharge instructions were provided to the                            patient.                           - Resume previous diet.                           - Continue present medications.                           -  Await pathology results. Docia Chuck. Henrene Pastor, MD 01/25/2023 1:58:04 PM This report has been signed electronically.

## 2023-01-26 ENCOUNTER — Telehealth: Payer: Self-pay

## 2023-01-26 NOTE — Telephone Encounter (Signed)
  Follow up Call-     01/25/2023   12:54 PM  Call back number  Post procedure Call Back phone  # [phone]  Permission to leave phone message Yes     Patient questions:  Do you have a fever, pain , or abdominal swelling? No. Pain Score  0 *  Have you tolerated food without any problems? Yes.    Have you been able to return to your normal activities? Yes.    Do you have any questions about your discharge instructions: Diet   No. Medications  No. Follow up visit  No.  Do you have questions or concerns about your Care? No.  Actions: * If pain score is 4 or above: No action needed, pain <4.

## 2023-01-29 ENCOUNTER — Encounter: Payer: Self-pay | Admitting: Internal Medicine

## 2023-05-05 ENCOUNTER — Encounter (HOSPITAL_BASED_OUTPATIENT_CLINIC_OR_DEPARTMENT_OTHER): Payer: Self-pay | Admitting: Surgery

## 2023-05-05 NOTE — Progress Notes (Addendum)
Addendum:  Called and spoke w/ pt via phone to let him know Dr Cliffton Asters has pre-op orders with bowel prep.  Pt verbalized understanding to obtain two fleet enema's (plain ) from any pharmacy , do one night before surgery and one am of surgery.  Spoke w/ via phone for pre-op interview--- pt Lab needs dos----    no           Lab results------ no COVID test -----patient states asymptomatic no test needed Arrive at ------- 0630 on 05-26-2023 NPO after MN NO Solid Food.  Clear liquids from MN until--- 0530 Med rec completed Medications to take morning of surgery ----- none Diabetic medication ----- n/a Patient instructed no nail polish to be worn day of surgery Patient instructed to bring photo id and insurance card day of surgery Patient aware to have Driver (ride ) / caregiver    for 24 hours after surgery -wife, Lafonda Mosses Patient Special Instructions ----- told pt I would call him when dr white has orders in to let him know if bowel prep was ordered since he did not received any instructions from office Pre-Op special Instructions ----- sent inbox message in epic to dr white, requested orders Patient verbalized understanding of instructions that were given at this phone interview. Patient denies shortness of breath, chest pain, fever, cough at this phone interview.

## 2023-05-10 ENCOUNTER — Ambulatory Visit: Payer: Self-pay | Admitting: Surgery

## 2023-05-10 DIAGNOSIS — Z01818 Encounter for other preprocedural examination: Secondary | ICD-10-CM

## 2023-05-19 NOTE — Patient Instructions (Signed)
DUE TO COVID-19 ONLY TWO VISITORS  (aged 49 and older)  ARE ALLOWED TO COME WITH YOU AND STAY IN THE WAITING ROOM ONLY DURING PRE OP AND PROCEDURE.   **NO VISITORS ARE ALLOWED IN THE SHORT STAY AREA OR RECOVERY ROOM!!**  IF YOU WILL BE ADMITTED INTO THE HOSPITAL YOU ARE ALLOWED ONLY FOUR SUPPORT PEOPLE DURING VISITATION HOURS ONLY (7 AM -8PM)   The support person(s) must pass our screening, gel in and out, and wear a mask at all times, including in the patient's room. Patients must also wear a mask when staff or their support person are in the room. Visitors GUEST BADGE MUST BE WORN VISIBLY  One adult visitor may remain with you overnight and MUST be in the room by 8 P.M.     Your procedure is scheduled on: 05/26/23   Report to [facility] Main Entrance    Report to admitting at : 6:15 AM   Call this number if you have problems the morning of surgery [phone]   Do not eat food :After Midnight.    Oral Hygiene is also important to reduce your risk of infection.                                    Remember - BRUSH YOUR TEETH THE MORNING OF SURGERY WITH YOUR REGULAR TOOTHPASTE  DENTURES WILL BE REMOVED PRIOR TO SURGERY PLEASE DO NOT APPLY "Poly grip" OR ADHESIVES!!!   Do NOT smoke after Midnight   Take these medicines the morning of surgery with A SIP OF WATER: N/A                              You may not have any metal on your body including hair pins, jewelry, and body piercing             Do not wear lotions, powders, perfumes/cologne, or deodorant               Men may shave face and neck.   Do not bring valuables to the hospital. [facility] IS NOT             RESPONSIBLE   FOR VALUABLES.   Contacts, glasses, or bridgework may not be worn into surgery.   Bring small overnight bag day of surgery.   DO NOT BRING YOUR HOME MEDICATIONS TO THE HOSPITAL. PHARMACY WILL DISPENSE MEDICATIONS LISTED ON YOUR MEDICATION LIST TO YOU DURING YOUR ADMISSION IN THE  HOSPITAL!    Patients discharged on the day of surgery will not be allowed to drive home.  Someone NEEDS to stay with you for the first 24 hours after anesthesia.   Special Instructions: Bring a copy of your healthcare power of attorney and living will documents         the day of surgery if you haven't scanned them before.              Please read over the following fact sheets you were given: IF YOU HAVE QUESTIONS ABOUT YOUR PRE-OP INSTRUCTIONS PLEASE CALL [phone]    [facility] - Preparing for Surgery Before surgery, you can play an important role.  Because skin is not sterile, your skin needs to be as free of germs as possible.  You can reduce the number of germs on your skin by washing with CHG (chlorahexidine gluconate)  soap before surgery.  CHG is an antiseptic cleaner which kills germs and bonds with the skin to continue killing germs even after washing. Please DO NOT use if you have an allergy to CHG or antibacterial soaps.  If your skin becomes reddened/irritated stop using the CHG and inform your nurse when you arrive at Short Stay. Do not shave (including legs and underarms) for at least 48 hours prior to the first CHG shower.  You may shave your face/neck. Please follow these instructions carefully:  1.  Shower with CHG Soap the night before surgery and the  morning of Surgery.  2.  If you choose to wash your hair, wash your hair first as usual with your  normal  shampoo.  3.  After you shampoo, rinse your hair and body thoroughly to remove the  shampoo.                           4.  Use CHG as you would any other liquid soap.  You can apply chg directly  to the skin and wash                       Gently with a scrungie or clean washcloth.  5.  Apply the CHG Soap to your body ONLY FROM THE NECK DOWN.   Do not use on face/ open                           Wound or open sores. Avoid contact with eyes, ears mouth and genitals (private parts).                       Wash face,  Genitals  (private parts) with your normal soap.             6.  Wash thoroughly, paying special attention to the area where your surgery  will be performed.  7.  Thoroughly rinse your body with warm water from the neck down.  8.  DO NOT shower/wash with your normal soap after using and rinsing off  the CHG Soap.                9.  Pat yourself dry with a clean towel.            10.  Wear clean pajamas.            11.  Place clean sheets on your bed the night of your first shower and do not  sleep with pets. Day of Surgery : Do not apply any lotions/deodorants the morning of surgery.  Please wear clean clothes to the hospital/surgery center.  FAILURE TO FOLLOW THESE INSTRUCTIONS MAY RESULT IN THE CANCELLATION OF YOUR SURGERY PATIENT SIGNATURE_________________________________  NURSE SIGNATURE__________________________________  ________________________________________________________________________

## 2023-05-20 ENCOUNTER — Encounter (HOSPITAL_COMMUNITY)
Admission: RE | Admit: 2023-05-20 | Discharge: 2023-05-20 | Disposition: A | Discharge status: Home / Self Care | Payer: Managed Care, Other (non HMO) | Source: Ambulatory Visit | Attending: Surgery | Admitting: Surgery

## 2023-05-20 ENCOUNTER — Other Ambulatory Visit: Payer: Self-pay

## 2023-05-20 ENCOUNTER — Encounter (HOSPITAL_COMMUNITY): Payer: Self-pay

## 2023-05-20 DIAGNOSIS — Z01812 Encounter for preprocedural laboratory examination: Secondary | ICD-10-CM | POA: Insufficient documentation

## 2023-05-20 DIAGNOSIS — Z01818 Encounter for other preprocedural examination: Secondary | ICD-10-CM

## 2023-05-20 LAB — CBC WITH DIFFERENTIAL/PLATELET
Abs Immature Granulocytes: 0.01 10*3/uL (ref 0.00–0.07)
Basophils Absolute: 0 10*3/uL (ref 0.0–0.1)
Basophils Relative: 0 %
Eosinophils Absolute: 0.1 10*3/uL (ref 0.0–0.5)
Eosinophils Relative: 2 %
HCT: 46.8 % (ref 39.0–52.0)
Hemoglobin: 15.1 g/dL (ref 13.0–17.0)
Immature Granulocytes: 0 %
Lymphocytes Relative: 42 %
Lymphs Abs: 2 10*3/uL (ref 0.7–4.0)
MCH: 27.2 pg (ref 26.0–34.0)
MCHC: 32.3 g/dL (ref 30.0–36.0)
MCV: 84.3 fL (ref 80.0–100.0)
Monocytes Absolute: 0.4 10*3/uL (ref 0.1–1.0)
Monocytes Relative: 9 %
Neutro Abs: 2.2 10*3/uL (ref 1.7–7.7)
Neutrophils Relative %: 47 %
Platelets: 196 10*3/uL (ref 150–400)
RBC: 5.55 MIL/uL (ref 4.22–5.81)
RDW: 12.1 % (ref 11.5–15.5)
WBC: 4.7 10*3/uL (ref 4.0–10.5)
nRBC: 0 % (ref 0.0–0.2)

## 2023-05-20 LAB — BASIC METABOLIC PANEL
Anion gap: 10 (ref 5–15)
BUN: 18 mg/dL (ref 6–20)
CO2: 25 mmol/L (ref 22–32)
Calcium: 9.3 mg/dL (ref 8.9–10.3)
Chloride: 103 mmol/L (ref 98–111)
Creatinine, Ser: 1 mg/dL (ref 0.61–1.24)
GFR, Estimated: >60 mL/min (ref >60)
Glucose, Bld: 109 mg/dL — ABNORMAL HIGH (ref 70–99)
Potassium: 4.2 mmol/L (ref 3.5–5.1)
Sodium: 138 mmol/L (ref 135–145)

## 2023-05-20 NOTE — Progress Notes (Signed)
For Short Stay: COVID SWAB appointment date:  Bowel Prep reminder: Reviewed.   For Anesthesia: PCP - Darrow Bussing, MD  Cardiologist - N/A  Chest x-ray -  EKG -  Stress Test -  ECHO -  Cardiac Cath -  Pacemaker/ICD device last checked: Pacemaker orders received: Device Rep notified:  Spinal Cord Stimulator: N/A  Sleep Study - N/A CPAP -   Fasting Blood Sugar - N/A Checks Blood Sugar _____ times a day Date and result of last Hgb A1c-  Last dose of GLP1 agonist- N/A GLP1 instructions:   Last dose of SGLT-2 inhibitors- N/A SGLT-2 instructions:   Blood Thinner Instructions: N/A Aspirin Instructions: Last Dose:  Activity level: Can go up a flight of stairs and activities of daily living without stopping and without chest pain and/or shortness of breath   Able to exercise without chest pain and/or shortness of breath  Anesthesia review:   Patient denies shortness of breath, fever, cough and chest pain at PAT appointment   Patient verbalized understanding of instructions that were given to them at the PAT appointment. Patient was also instructed that they will need to review over the PAT instructions again at home before surgery.

## 2023-05-25 NOTE — Anesthesia Preprocedure Evaluation (Signed)
Anesthesia Evaluation  Patient identified by MRN, date of birth, ID band Patient awake    Reviewed: Allergy & Precautions, NPO status , Patient's Chart, lab work & pertinent test results  History of Anesthesia Complications Negative for: history of anesthetic complications  Airway Mallampati: III  TM Distance: >3 FB Neck ROM: Full   Comment: Previous grade I view with MAC 4, easy mask Dental  (+) Dental Advisory Given   Pulmonary neg pulmonary ROS   Pulmonary exam normal breath sounds clear to auscultation       Cardiovascular negative cardio ROS  Rhythm:Regular Rate:Normal     Neuro/Psych negative neurological ROS     GI/Hepatic negative GI ROS, Neg liver ROS,,,Anal fistula   Endo/Other  negative endocrine ROS    Renal/GU negative Renal ROS     Musculoskeletal   Abdominal   Peds  Hematology negative hematology ROS (+)   Anesthesia Other Findings   Reproductive/Obstetrics                             Anesthesia Physical Anesthesia Plan  ASA: 2  Anesthesia Plan: General   Post-op Pain Management: Tylenol PO (pre-op)*   Induction: Intravenous  PONV Risk Score and Plan: 2 and Ondansetron, Dexamethasone and Treatment may vary due to age or medical condition  Airway Management Planned: Oral ETT  Additional Equipment:   Intra-op Plan:   Post-operative Plan: Extubation in OR  Informed Consent: I have reviewed the patients History and Physical, chart, labs and discussed the procedure including the risks, benefits and alternatives for the proposed anesthesia with the patient or authorized representative who has indicated his/her understanding and acceptance.     Dental advisory given  Plan Discussed with: CRNA and Anesthesiologist  Anesthesia Plan Comments: (Risks of general anesthesia discussed including, but not limited to, sore throat, hoarse voice, chipped/damaged teeth,  injury to vocal cords, nausea and vomiting, allergic reactions, lung infection, heart attack, stroke, and death. All questions answered. )       Anesthesia Quick Evaluation

## 2023-05-26 ENCOUNTER — Ambulatory Visit (HOSPITAL_COMMUNITY): Payer: Managed Care, Other (non HMO) | Admitting: Certified Registered Nurse Anesthetist

## 2023-05-26 ENCOUNTER — Ambulatory Visit (HOSPITAL_BASED_OUTPATIENT_CLINIC_OR_DEPARTMENT_OTHER): Payer: Managed Care, Other (non HMO) | Admitting: Certified Registered Nurse Anesthetist

## 2023-05-26 ENCOUNTER — Ambulatory Visit (HOSPITAL_COMMUNITY)
Admission: RE | Admit: 2023-05-26 | Discharge: 2023-05-26 | Disposition: A | Discharge status: Home / Self Care | Payer: Managed Care, Other (non HMO) | Attending: Surgery | Admitting: Surgery

## 2023-05-26 ENCOUNTER — Encounter (HOSPITAL_COMMUNITY)
Admission: RE | Disposition: A | Discharge status: Home / Self Care | Payer: Self-pay | Source: Home / Self Care | Attending: Surgery

## 2023-05-26 ENCOUNTER — Encounter (HOSPITAL_COMMUNITY): Payer: Self-pay | Admitting: Surgery

## 2023-05-26 ENCOUNTER — Other Ambulatory Visit: Payer: Self-pay

## 2023-05-26 DIAGNOSIS — K603 Anal fistula: Secondary | ICD-10-CM

## 2023-05-26 DIAGNOSIS — Z9049 Acquired absence of other specified parts of digestive tract: Secondary | ICD-10-CM | POA: Diagnosis not present

## 2023-05-26 DIAGNOSIS — Z01818 Encounter for other preprocedural examination: Secondary | ICD-10-CM

## 2023-05-26 HISTORY — PX: ANAL FISTULOTOMY: SHX6423

## 2023-05-26 HISTORY — DX: Personal history of urinary calculi: Z87.442

## 2023-05-26 HISTORY — PX: RECTAL EXAM UNDER ANESTHESIA: SHX6399

## 2023-05-26 HISTORY — DX: Anal fistula: K60.3

## 2023-05-26 HISTORY — DX: Anal fistula, unspecified: K60.30

## 2023-05-26 SURGERY — ANAL FISTULOTOMY
Anesthesia: General

## 2023-05-26 MED ORDER — BUPIVACAINE LIPOSOME 1.3 % IJ SUSP
INTRAMUSCULAR | Status: AC
  Filled 2023-05-26: qty 20

## 2023-05-26 MED ORDER — BUPIVACAINE-EPINEPHRINE (PF) 0.5% -1:200000 IJ SOLN
INTRAMUSCULAR | Status: AC
  Filled 2023-05-26: qty 30

## 2023-05-26 MED ORDER — TRAMADOL HCL 50 MG PO TABS: 50.0000 mg | ORAL_TABLET | Freq: Four times a day (QID) | ORAL | 0 refills | Status: AC | PRN

## 2023-05-26 MED ORDER — AMISULPRIDE (ANTIEMETIC) 5 MG/2ML IV SOLN: 10.0000 mg | Freq: Once | INTRAVENOUS | Status: DC | PRN

## 2023-05-26 MED ORDER — LACTATED RINGERS IV SOLN: INTRAVENOUS | Status: DC

## 2023-05-26 MED ORDER — ONDANSETRON HCL 4 MG/2ML IJ SOLN
INTRAMUSCULAR | Status: AC
  Filled 2023-05-26: qty 2

## 2023-05-26 MED ORDER — DEXAMETHASONE SODIUM PHOSPHATE 10 MG/ML IJ SOLN
INTRAMUSCULAR | Status: DC | PRN
  Administered 2023-05-26: 10 mg via INTRAVENOUS

## 2023-05-26 MED ORDER — BUPIVACAINE LIPOSOME 1.3 % IJ SUSP: 20.0000 mL | Freq: Once | INTRAMUSCULAR | Status: DC

## 2023-05-26 MED ORDER — LIDOCAINE 2% (20 MG/ML) 5 ML SYRINGE
INTRAMUSCULAR | Status: DC | PRN
  Administered 2023-05-26: 100 mg via INTRAVENOUS

## 2023-05-26 MED ORDER — FLEET ENEMA 7-19 GM/118ML RE ENEM: 1.0000 | ENEMA | Freq: Once | RECTAL | Status: DC

## 2023-05-26 MED ORDER — FENTANYL CITRATE (PF) 250 MCG/5ML IJ SOLN
INTRAMUSCULAR | Status: AC
  Filled 2023-05-26: qty 5

## 2023-05-26 MED ORDER — FENTANYL CITRATE (PF) 100 MCG/2ML IJ SOLN
INTRAMUSCULAR | Status: DC | PRN
  Administered 2023-05-26: 100 ug via INTRAVENOUS
  Administered 2023-05-26: 50 ug via INTRAVENOUS

## 2023-05-26 MED ORDER — DEXAMETHASONE SODIUM PHOSPHATE 10 MG/ML IJ SOLN
INTRAMUSCULAR | Status: AC
  Filled 2023-05-26: qty 1

## 2023-05-26 MED ORDER — FLEET ENEMA 7-19 GM/118ML RE ENEM
1.0000 | ENEMA | Freq: Once | RECTAL | Status: DC
  Filled 2023-05-26: qty 1

## 2023-05-26 MED ORDER — 0.9 % SODIUM CHLORIDE (POUR BTL) OPTIME
TOPICAL | Status: DC | PRN
  Administered 2023-05-26: 1000 mL

## 2023-05-26 MED ORDER — PROPOFOL 10 MG/ML IV BOLUS
INTRAVENOUS | Status: DC | PRN
  Administered 2023-05-26: 150 mg via INTRAVENOUS

## 2023-05-26 MED ORDER — SUGAMMADEX SODIUM 200 MG/2ML IV SOLN
INTRAVENOUS | Status: DC | PRN
  Administered 2023-05-26: 200 mg via INTRAVENOUS

## 2023-05-26 MED ORDER — ROCURONIUM BROMIDE 10 MG/ML (PF) SYRINGE
PREFILLED_SYRINGE | INTRAVENOUS | Status: DC | PRN
  Administered 2023-05-26: 50 mg via INTRAVENOUS

## 2023-05-26 MED ORDER — FENTANYL CITRATE PF 50 MCG/ML IJ SOSY: 25.0000 ug | PREFILLED_SYRINGE | INTRAMUSCULAR | Status: DC | PRN

## 2023-05-26 MED ORDER — ONDANSETRON HCL 4 MG/2ML IJ SOLN
INTRAMUSCULAR | Status: DC | PRN
  Administered 2023-05-26: 4 mg via INTRAVENOUS

## 2023-05-26 MED ORDER — METHYLENE BLUE (ANTIDOTE) 1 % IV SOLN
INTRAVENOUS | Status: AC
  Filled 2023-05-26: qty 10

## 2023-05-26 MED ORDER — ACETAMINOPHEN 500 MG PO TABS
1000.0000 mg | ORAL_TABLET | ORAL | Status: AC
  Administered 2023-05-26: 1000 mg via ORAL
  Filled 2023-05-26: qty 2

## 2023-05-26 MED ORDER — PROPOFOL 10 MG/ML IV BOLUS
INTRAVENOUS | Status: AC
  Filled 2023-05-26: qty 20

## 2023-05-26 MED ORDER — ROCURONIUM BROMIDE 10 MG/ML (PF) SYRINGE
PREFILLED_SYRINGE | INTRAVENOUS | Status: AC
  Filled 2023-05-26: qty 10

## 2023-05-26 MED ORDER — OXYCODONE HCL 5 MG/5ML PO SOLN: 5.0000 mg | Freq: Once | ORAL | Status: DC | PRN

## 2023-05-26 MED ORDER — SODIUM CHLORIDE (PF) 0.9 % IJ SOLN
INTRAMUSCULAR | Status: AC
  Filled 2023-05-26: qty 20

## 2023-05-26 MED ORDER — LIDOCAINE HCL (PF) 2 % IJ SOLN
INTRAMUSCULAR | Status: AC
  Filled 2023-05-26: qty 5

## 2023-05-26 MED ORDER — MIDAZOLAM HCL 5 MG/5ML IJ SOLN
INTRAMUSCULAR | Status: DC | PRN
  Administered 2023-05-26: 2 mg via INTRAVENOUS

## 2023-05-26 MED ORDER — BUPIVACAINE-EPINEPHRINE (PF) 0.5% -1:200000 IJ SOLN
INTRAMUSCULAR | Status: DC | PRN
  Administered 2023-05-26: 40 mL

## 2023-05-26 MED ORDER — MIDAZOLAM HCL 2 MG/2ML IJ SOLN
INTRAMUSCULAR | Status: AC
  Filled 2023-05-26: qty 2

## 2023-05-26 MED ORDER — OXYCODONE HCL 5 MG PO TABS: 5.0000 mg | ORAL_TABLET | Freq: Once | ORAL | Status: DC | PRN

## 2023-05-26 SURGICAL SUPPLY — 42 items
APL SKNCLS STERI-STRIP NONHPOA (GAUZE/BANDAGES/DRESSINGS) ×1
BAG COUNTER SPONGE SURGICOUNT (BAG) IMPLANT
BAG SPNG CNTER NS LX DISP (BAG)
BENZOIN TINCTURE PRP APPL 2/3 (GAUZE/BANDAGES/DRESSINGS) ×1 IMPLANT
BLADE SURG 15 STRL LF DISP TIS (BLADE) IMPLANT
BLADE SURG 15 STRL SS (BLADE)
BRIEF MESH DISP LRG (UNDERPADS AND DIAPERS) ×1 IMPLANT
CNTNR URN SCR LID CUP LEK RST (MISCELLANEOUS) ×1 IMPLANT
CONT SPEC 4OZ STRL OR WHT (MISCELLANEOUS) ×1
COVER SURGICAL LIGHT HANDLE (MISCELLANEOUS) ×1 IMPLANT
DRAPE LAPAROTOMY T 102X78X121 (DRAPES) ×1 IMPLANT
ELECT NDL BLADE 2-5/6 (NEEDLE) ×1 IMPLANT
ELECT NEEDLE BLADE 2-5/6 (NEEDLE) ×1
ELECT REM PT RETURN 15FT ADLT (MISCELLANEOUS) ×1 IMPLANT
GAUZE 4X4 16PLY ~~LOC~~+RFID DBL (SPONGE) ×1 IMPLANT
GAUZE PAD ABD 8X10 STRL (GAUZE/BANDAGES/DRESSINGS) IMPLANT
GAUZE SPONGE 4X4 12PLY STRL (GAUZE/BANDAGES/DRESSINGS) IMPLANT
GLOVE BIO SURGEON STRL SZ7.5 (GLOVE) ×1 IMPLANT
GLOVE INDICATOR 8.0 STRL GRN (GLOVE) ×1 IMPLANT
GOWN STRL REUS W/ TWL XL LVL3 (GOWN DISPOSABLE) ×2 IMPLANT
GOWN STRL REUS W/TWL XL LVL3 (GOWN DISPOSABLE) ×2
KIT BASIN OR (CUSTOM PROCEDURE TRAY) ×1 IMPLANT
KIT TURNOVER KIT A (KITS) IMPLANT
LOOP VESSEL MAXI BLUE (MISCELLANEOUS) IMPLANT
NDL HYPO 22X1.5 SAFETY MO (MISCELLANEOUS) ×1 IMPLANT
NEEDLE HYPO 22X1.5 SAFETY MO (MISCELLANEOUS) ×1
PACK BASIC VI WITH GOWN DISP (CUSTOM PROCEDURE TRAY) ×1 IMPLANT
PENCIL SMOKE EVACUATOR (MISCELLANEOUS) IMPLANT
SHEARS HARMONIC 9CM CVD (BLADE) IMPLANT
SPIKE FLUID TRANSFER (MISCELLANEOUS) ×1 IMPLANT
SURGILUBE 2OZ TUBE FLIPTOP (MISCELLANEOUS) ×1 IMPLANT
SUT CHROMIC 2 0 SH (SUTURE) ×1 IMPLANT
SUT CHROMIC 3 0 SH 27 (SUTURE) IMPLANT
SUT SILK 2 0 (SUTURE) ×1
SUT SILK 2-0 18XBRD TIE 12 (SUTURE) IMPLANT
SUT VIC AB 2-0 SH 27 (SUTURE)
SUT VIC AB 2-0 SH 27X BRD (SUTURE) IMPLANT
SUT VIC AB 2-0 UR6 27 (SUTURE) ×6 IMPLANT
SYR 20ML LL LF (SYRINGE) ×1 IMPLANT
SYR 3ML LL SCALE MARK (SYRINGE) IMPLANT
TOWEL OR 17X26 10 PK STRL BLUE (TOWEL DISPOSABLE) ×1 IMPLANT
TOWEL OR NON WOVEN STRL DISP B (DISPOSABLE) ×1 IMPLANT

## 2023-05-26 NOTE — Anesthesia Procedure Notes (Signed)
Procedure Name: Intubation Date/Time: 05/26/2023 8:16 AM  Performed by: Orest Dikes, CRNAPre-anesthesia Checklist: Patient identified, Emergency Drugs available, Suction available and Patient being monitored Patient Re-evaluated:Patient Re-evaluated prior to induction Oxygen Delivery Method: Circle system utilized Preoxygenation: Pre-oxygenation with 100% oxygen Induction Type: IV induction Ventilation: Mask ventilation without difficulty Laryngoscope Size: Mac and 4 Grade View: Grade I Tube type: Oral Tube size: 7.5 mm Number of attempts: 1 Airway Equipment and Method: Stylet Placement Confirmation: ETT inserted through vocal cords under direct vision, positive ETCO2 and breath sounds checked- equal and bilateral Secured at: 22 cm Tube secured with: Tape Dental Injury: Teeth and Oropharynx as per pre-operative assessment

## 2023-05-26 NOTE — H&P (Signed)
CC: Here today for surgery  HPI: Jeremiah Baker is an 47 y.o. male here for surgery  Colonoscopy with Dr. Marina Goodell 01/25/23 -  Examined portion of ileum normal 4 mm polyp in the ascending colon, removed. Internal hemorrhoids Photos show punctate opening suspicious in appearance for external opening of anal fistula  Reports an approximate 1 year history of perirectal discomfort where he had some mucopurulent material draining a year ago. Occasionally blood-tinged. He has tried a myriad of topical ointments and creams. He was seen with gastroenterology, Dr. Marina Goodell, who ultimately performed the above mentioned colonoscopy. Referred to see [place] for possible fistula.  Currently, it is more in swollen state. Scant if any drainage. No significant pain per se. It will swell and decompressed well and decompressed in a somewhat cyclical manner.  PMH: denies  PSH: Cholecystectomy, laparoscopic-06/2015  FHx: Denies any known family history of colorectal, breast, endometrial or ovarian cancer  Social Hx: Denies use of tobacco/EtOH/illicit drug. He works as a Clinical cytogeneticist. He is here today by himself. He is originally from [place]. He speaks both [place] and [place]. Also notes Jewish ancestry.   He denies any changes in health or health history since we met in the office. No new medications/allergies. He states he is ready for surgery today.  Past Medical History:  Diagnosis Date   Anal fistula    History of kidney stones    per pt passed stones approx 1990s    Past Surgical History:  Procedure Laterality Date   COLONOSCOPY WITH PROPOFOL  01/25/2023   dr Marina Goodell   LAPAROSCOPIC CHOLECYSTECTOMY SINGLE SITE WITH INTRAOPERATIVE CHOLANGIOGRAM N/A 07/25/2015   Procedure: LAPAROSCOPIC CHOLECYSTECTOMY SINGLE SITE WITH INTRAOPERATIVE CHOLANGIOGRAM;  Surgeon: Karie Soda, MD;  Location: [facility];  Service: General;  Laterality: N/A;   WISDOM TOOTH EXTRACTION       Family History  Problem Relation Age of Onset   Non-Hodgkin's lymphoma Father    Hypertension Mother    Colon polyps Mother    Colon cancer Neg Hx    Esophageal cancer Neg Hx    Stomach cancer Neg Hx    Rectal cancer Neg Hx     Social:  reports that he has never smoked. He has never used smokeless tobacco. He reports that he does not drink alcohol and does not use drugs.  Allergies:  Allergies  Allergen Reactions   Other Hives    Mahi-mahi    Shrimp (Diagnostic) Hives   Gadobutrol Hives    Pt displayed hives in neck and chest post contrast.     Medications: I have reviewed the patient's current medications.  No results found for this or any previous visit (from the past 48 hour(s)).  No results found.   PE Blood pressure (!) 144/85, pulse 95, temperature 98.4 F (36.9 C), temperature source Oral, resp. rate 18, height 5\' 7"  (1.702 m), weight 73 kg, SpO2 97%. Constitutional: NAD; conversant Eyes: Moist conjunctiva; no lid lag; anicteric Lungs: Normal respiratory effort CV: RRR Psychiatric: Appropriate affect  No results found for this or any previous visit (from the past 48 hour(s)).  No results found.  A/P: Jeremiah Baker is an 47 y.o. male with no reported medical hx here for evaluation of suspected anal fistula  No evidence of intraluminal Crohn's disease on colonoscopy 01/2023  -The anatomy and physiology of the anal canal was discussed with the patient with associated pictures. The pathophysiology of anal abscess/fistula was discussed at length with associated  pictures and illustrations - using the ASCRS handbook -We have reviewed options going forward including further observation vs surgery -surgical treatment of anal fistula; possible seton; anorectal exam under anesthesia -We discussed that based upon the intraoperative classification of this fistula, possibility of seton versus fistulotomy. We discussed that if a draining seton is placed, the  subsequent need for additional procedures. -The planned procedure, material risks (including, but not limited to, pain, bleeding, infection, scarring, need for blood transfusion, damage to anal sphincter, incontinence of gas and/or stool, need for additional procedures, anal stenosis, rare cases of pelvic sepsis which in severe cases may require things like a colostomy, recurrence, pneumonia, heart attack, stroke, death) benefits and alternatives to surgery were discussed at length. I noted a good probability that the procedure would help improve their symptoms. The patient's questions were answered to his satisfaction, he voiced understanding and has stated that if he decides to proceed with surgery that he will call and schedule this. He has been offered phone call during a follow-up appointment but states he will call us when he decides how he would like to proceed.. Additionally, we discussed typical postoperative expectations and the recovery process.   Marin Olp, MD [facility]

## 2023-05-26 NOTE — Transfer of Care (Signed)
Immediate Anesthesia Transfer of Care Note  Patient: Jeremiah Baker  Procedure(s) Performed: Burnadette Pop PLACEMENT ANORECTAL EXAM UNDER ANESTHESIA  Patient Location: PACU  Anesthesia Type:General  Level of Consciousness: awake, alert , and oriented  Airway & Oxygen Therapy: Patient Spontanous Breathing and Patient connected to face mask oxygen  Post-op Assessment: Report given to RN and Post -op Vital signs reviewed and stable  Post vital signs: Reviewed and stable  Last Vitals:  Vitals Value Taken Time  BP    Temp    Pulse 83 05/26/23 0905  Resp 13 05/26/23 0905  SpO2 100 % 05/26/23 0905  Vitals shown include unfiled device data.  Last Pain:  Vitals:   05/26/23 0700  TempSrc:   PainSc: 0-No pain      Patients Stated Pain Goal: 3 (05/26/23 0700)  Complications: No notable events documented.

## 2023-05-26 NOTE — Anesthesia Postprocedure Evaluation (Signed)
Anesthesia Post Note  Patient: Jeremiah Baker  Procedure(s) Performed: SETON PLACEMENT ANORECTAL EXAM UNDER ANESTHESIA     Patient location during evaluation: PACU Anesthesia Type: General Level of consciousness: awake Pain management: pain level controlled Vital Signs Assessment: post-procedure vital signs reviewed and stable Respiratory status: spontaneous breathing, nonlabored ventilation and respiratory function stable Cardiovascular status: blood pressure returned to baseline and stable Postop Assessment: no apparent nausea or vomiting Anesthetic complications: no   No notable events documented.  Last Vitals:  Vitals:   05/26/23 0930 05/26/23 0937  BP: 121/73 (!) 145/95  Pulse: 80 73  Resp: 10 15  Temp: (!) 36.3 C   SpO2: 98% 99%    Last Pain:  Vitals:   05/26/23 0937  TempSrc:   PainSc: 0-No pain                 Linton Rump

## 2023-05-26 NOTE — Discharge Instructions (Addendum)
ANORECTAL SURGERY: POST OP INSTRUCTIONS A draining seton was placed today - you were found to have a "transsphincteric" anal fistula. We will plan for an additional procedure in the next ~3 months to further address your anal fistula.  DIET: Follow a light bland diet the first 24 hours after arrival home, such as soup, liquids, crackers, etc.  Be sure to include lots of fluids daily.  Avoid fast food or heavy meals as your are more likely to get nauseated.  Eat a low fat diet the next few days after surgery.   Some bleeding with bowel movements is expected for the first couple of days but this should stop in between bowel movements  Take your usually prescribed home medications unless otherwise directed. No foreign bodies per rectum for the next 3 months (enemas, etc)  PAIN CONTROL: It is helpful to take an over-the-counter pain medication regularly for the first few days/weeks.  Choose from the following that works best for you: Ibuprofen (Advil, etc) Three 200mg  tabs every 6 hours as needed. Acetaminophen (Tylenol, etc) 500-650mg  every 6 hours as needed NOTE: You may take both of these medications together - most patients find it most helpful when alternating between the two (i.e. Ibuprofen at 6am, tylenol at 9am, ibuprofen at 12pm ..Marland Kitchen) A  prescription for pain medication may have been prescribed for you at discharge.  Take your pain medication as prescribed.  If you are having problems/concerns with the prescription medicine, please call us for further advice.  Avoid getting constipated.  Between the surgery and the pain medications, it is common to experience some constipation.  Increasing fluid intake (64oz of water per day) and taking a fiber supplement (such as Metamucil, Citrucel, FiberCon) 1-2 times a day regularly will usually help prevent this problem from occurring.  Take Miralax (over the counter) 1-2x/day while taking a narcotic pain medication. If no bowel movement after 48hours, you  may additionally take a laxative like a bottle of Milk of Magnesia which can be purchased over the counter. Avoid enemas.   Watch out for diarrhea.  If you have many loose bowel movements, simplify your diet to bland foods.  Stop any stool softeners and decrease your fiber supplement. If this worsens or does not improve, please call us.  Wash / shower every day.  If you were discharged with a dressing, you may remove this the day after your surgery. You may shower normally, getting soap/water on your wound, particularly after bowel movements.  Soaking in a warm bath filled a couple inches ("Sitz bath") is a great way to clean the area after a bowel movement and many patients find it is a way to soothe the area.  ACTIVITIES as tolerated:   You may resume regular (light) daily activities beginning the next day--such as daily self-care, walking, climbing stairs--gradually increasing activities as tolerated.  If you can walk 30 minutes without difficulty, it is safe to try more intense activity such as jogging, treadmill, bicycling, low-impact aerobics, etc. Refrain from any heavy lifting or straining for the first 2 weeks after your procedure, particularly if your surgery was for hemorrhoids. Avoid activities that make your pain worse You may drive when you are no longer taking prescription pain medication, you can comfortably wear a seatbelt, and you can safely maneuver your car and apply brakes.  FOLLOW UP in our office Please call [facility] at [phone] to set up an appointment to see your surgeon in the office for a follow-up appointment approximately  2 weeks after your surgery. Make sure that you call for this appointment the day you arrive home to insure a convenient appointment time.  9. If you have disability or family leave forms that need to be completed, you may have them completed by your primary care physician's office; for return to work instructions, please ask our office staff and they  will be happy to assist you in obtaining this documentation   When to call us [phone]: Poor pain control Reactions / problems with new medications (rash/itching, etc)  Fever over 101.5 F (38.5 C) Inability to urinate Nausea/vomiting Worsening swelling or bruising Continued bleeding from incision. Increased pain, redness, or drainage from the incision  The clinic staff is available to answer your questions during regular business hours (8:30am-5pm).  Please don't hesitate to call and ask to speak to one of our nurses for clinical concerns.   A surgeon from [facility] is always on call at the hospitals   If you have a medical emergency, go to the nearest emergency room or call 911.   [facility] [address], [address], [place], [place]  [address] MAIN: [phone] FAX: [phone] [email or web address]

## 2023-05-26 NOTE — Op Note (Signed)
05/26/2023  8:56 AM  PATIENT:  Jeremiah Baker  47 y.o. male  Patient Care Team: Darrow Bussing, MD as PCP - General (Family Medicine)  PRE-OPERATIVE DIAGNOSIS:  Anal fistula  POST-OPERATIVE DIAGNOSIS:  Transsphincteric anal fistula  PROCEDURE:   Surgical treatment of anal fistula transsphincteric with placement of seton Anorectal exam under anesthesia  SURGEON:  Surgeon(s): Andria Meuse, MD  ASSISTANT: OR staff   ANESTHESIA:   local and general  SPECIMEN:  No Specimen  DISPOSITION OF SPECIMEN:  N/A  COUNTS:  Sponge, needle, and instrument counts were reported correct x2 at conclusion.  EBL: 2 mL  Drains: Draining vessel loop seton  PLAN OF CARE: Discharge to home after PACU  PATIENT DISPOSITION:  PACU - hemodynamically stable.  OR FINDINGS: Right anterior transsphincteric anal fistula, ultimately controlled with blue vessel loop draining seton.  DESCRIPTION: The patient was identified in the preoperative holding area and taken to the OR. SCDs were applied.  He then underwent general endotracheal anesthesia without difficulty. The patient was then rolled onto the OR table in the prone jackknife position. Pressure points were then evaluated and padded. Benzoin was applied to the buttocks and they were gently taped apart.  He was then prepped and draped in usual sterile fashion.  A surgical timeout was performed indicating the correct patient, procedure, and positioning.  A perianal block was then created using a dilute mixture of 0.25% Marcaine with epinephrine and Exparel.  After ascertaining an appropriate level of anesthesia had been achieved, a well lubricated digital rectal exam was performed. This demonstrated no palpable masses.  A Hill-Ferguson anoscope was into the anal canal and circumferential inspection demonstrated healthy appearing anoderm.  No ulcerations or fissures.  No significant hemorrhoidal component.    Externally, in the right anterior  position there is a evident external opening to an anal fistula.  This is carefully explored bluntly.  Were able to cautiously pass a fistula probe taking care to not create any false passages through the tract.  There is a palpable cord emanating towards the anterior midline.  Were able to easily identify the internal opening which sits at or just distal to the dentate line.  The tract is then palpated.  It is found to involve both portions of the external and internal sphincter muscle.  This is clinically consistent with a transsphincteric anal fistula.  There is no significant abscess cavity or other evident tracts.  There are no other internal openings that we can clearly identify on evaluation today.  Given the findings of a transsphincteric type fistula we opted to proceed with placement of a draining vessel loop seton.  The fistula probe was then exchanged for a 3-0 Vicryl suture which we are then able to used to pass the blue vessel loop seton.  The seton is then secured to itself using 0 silk ties.  The anal canal is then irrigated.  Hemostasis is verified.  Additional local anesthetic is infiltrated at the surgical site.  All sponge, needle, and instrument counts are reported correct.  The buttocks are untaped.  A dressing consisting of 4 x 4's, ABD, and mesh underwear was placed.  He is then rolled back onto the stretcher, awakened from anesthesia, extubated, transferred to the recovery room in satisfactory condition.  DISPOSITION: PACU in satisfactory condition.

## 2023-05-27 ENCOUNTER — Encounter (HOSPITAL_COMMUNITY): Payer: Self-pay | Admitting: Surgery

## 2023-08-19 ENCOUNTER — Encounter (HOSPITAL_BASED_OUTPATIENT_CLINIC_OR_DEPARTMENT_OTHER): Payer: Self-pay | Admitting: Surgery

## 2023-08-19 NOTE — Progress Notes (Signed)
Spoke w/ via phone for pre-op interview--- pt Lab needs dos----  no       Lab results------ no COVID test -----patient states asymptomatic no test needed Arrive at ------- 0730 on 08-26-2023 NPO after MN NO Solid Food.  Clear liquids from MN until--- 0630 Med rec completed Medications to take morning of surgery ----- none Diabetic medication ----- n/a Patient instructed no nail polish to be worn day of surgery Patient instructed to bring photo id and insurance card day of surgery Patient aware to have Driver (ride ) / caregiver    for 24 hours after surgery - wife, Jeremiah Baker Patient Special Instructions ----- n/a Pre-Op special Instructions ----- sent inbox message to dr white in epic, requested orders Patient verbalized understanding of instructions that were given at this phone interview. Patient denies chest pain, sob, fever, cough at the interview.

## 2023-08-20 ENCOUNTER — Ambulatory Visit: Payer: Self-pay | Admitting: Surgery

## 2023-08-25 NOTE — Anesthesia Preprocedure Evaluation (Signed)
Anesthesia Evaluation  Patient identified by MRN, date of birth, ID band Patient awake    Reviewed: Allergy & Precautions, NPO status , Patient's Chart, lab work & pertinent test results  Airway Mallampati: II  TM Distance: >3 FB Neck ROM: Full    Dental no notable dental hx. (+) Teeth Intact, Dental Advisory Given   Pulmonary neg pulmonary ROS   Pulmonary exam normal breath sounds clear to auscultation       Cardiovascular negative cardio ROS Normal cardiovascular exam Rhythm:Regular Rate:Normal     Neuro/Psych negative neurological ROS  negative psych ROS   GI/Hepatic negative GI ROS, Neg liver ROS,,,  Endo/Other    Renal/GU negative Renal ROS     Musculoskeletal negative musculoskeletal ROS (+)    Abdominal   Peds  Hematology negative hematology ROS (+)   Anesthesia Other Findings   Reproductive/Obstetrics negative OB ROS                             Anesthesia Physical Anesthesia Plan  ASA: 1  Anesthesia Plan: General   Post-op Pain Management: Tylenol PO (pre-op)*   Induction: Intravenous  PONV Risk Score and Plan: Treatment may vary due to age or medical condition, Midazolam and Ondansetron  Airway Management Planned: Oral ETT  Additional Equipment: None  Intra-op Plan:   Post-operative Plan: Extubation in OR  Informed Consent: I have reviewed the patients History and Physical, chart, labs and discussed the procedure including the risks, benefits and alternatives for the proposed anesthesia with the patient or authorized representative who has indicated his/her understanding and acceptance.     Dental advisory given  Plan Discussed with: CRNA  Anesthesia Plan Comments:         Anesthesia Quick Evaluation

## 2023-08-26 ENCOUNTER — Encounter (HOSPITAL_BASED_OUTPATIENT_CLINIC_OR_DEPARTMENT_OTHER): Payer: Self-pay | Admitting: Surgery

## 2023-08-26 ENCOUNTER — Ambulatory Visit (HOSPITAL_BASED_OUTPATIENT_CLINIC_OR_DEPARTMENT_OTHER): Payer: 59 | Admitting: Anesthesiology

## 2023-08-26 ENCOUNTER — Other Ambulatory Visit: Payer: Self-pay

## 2023-08-26 ENCOUNTER — Encounter (HOSPITAL_BASED_OUTPATIENT_CLINIC_OR_DEPARTMENT_OTHER)
Admission: RE | Disposition: A | Discharge status: Home / Self Care | Payer: Self-pay | Source: Home / Self Care | Attending: Surgery

## 2023-08-26 ENCOUNTER — Ambulatory Visit (HOSPITAL_BASED_OUTPATIENT_CLINIC_OR_DEPARTMENT_OTHER)
Admission: RE | Admit: 2023-08-26 | Discharge: 2023-08-26 | Disposition: A | Discharge status: Home / Self Care | Payer: 59 | Attending: Surgery | Admitting: Surgery

## 2023-08-26 DIAGNOSIS — K603 Anal fistula, unspecified: Secondary | ICD-10-CM

## 2023-08-26 DIAGNOSIS — K60329 Anal fistula, complex, unspecified: Secondary | ICD-10-CM | POA: Diagnosis present

## 2023-08-26 DIAGNOSIS — Z01818 Encounter for other preprocedural examination: Secondary | ICD-10-CM

## 2023-08-26 HISTORY — PX: LIGATION OF INTERNAL FISTULA TRACT: SHX6551

## 2023-08-26 SURGERY — LIGATION, INTERNAL FISTULA TRACT
Anesthesia: General

## 2023-08-26 MED ORDER — LIDOCAINE HCL (PF) 2 % IJ SOLN
INTRAMUSCULAR | Status: AC
Start: 2023-08-26 — End: ?
  Filled 2023-08-26: qty 5

## 2023-08-26 MED ORDER — FENTANYL CITRATE (PF) 100 MCG/2ML IJ SOLN
INTRAMUSCULAR | Status: AC
  Filled 2023-08-26: qty 2

## 2023-08-26 MED ORDER — CHLORHEXIDINE GLUCONATE CLOTH 2 % EX PADS: 6.0000 | MEDICATED_PAD | Freq: Once | CUTANEOUS | Status: DC

## 2023-08-26 MED ORDER — MIDAZOLAM HCL 2 MG/2ML IJ SOLN
INTRAMUSCULAR | Status: AC
  Filled 2023-08-26: qty 2

## 2023-08-26 MED ORDER — TRAMADOL HCL 50 MG PO TABS
50.0000 mg | ORAL_TABLET | Freq: Four times a day (QID) | ORAL | 0 refills | Status: AC | PRN
Start: 2023-08-26 — End: 2023-08-31

## 2023-08-26 MED ORDER — SUGAMMADEX SODIUM 200 MG/2ML IV SOLN
INTRAVENOUS | Status: DC | PRN
  Administered 2023-08-26: 150 mg via INTRAVENOUS

## 2023-08-26 MED ORDER — MIDAZOLAM HCL 5 MG/5ML IJ SOLN
INTRAMUSCULAR | Status: DC | PRN
  Administered 2023-08-26: 2 mg via INTRAVENOUS

## 2023-08-26 MED ORDER — KETOROLAC TROMETHAMINE 30 MG/ML IJ SOLN
INTRAMUSCULAR | Status: DC | PRN
  Administered 2023-08-26: 30 mg via INTRAVENOUS

## 2023-08-26 MED ORDER — PROPOFOL 10 MG/ML IV BOLUS
INTRAVENOUS | Status: AC
  Filled 2023-08-26: qty 20

## 2023-08-26 MED ORDER — ONDANSETRON HCL 4 MG/2ML IJ SOLN: 4.0000 mg | Freq: Once | INTRAMUSCULAR | Status: DC | PRN

## 2023-08-26 MED ORDER — ONDANSETRON HCL 4 MG/2ML IJ SOLN
INTRAMUSCULAR | Status: DC | PRN
  Administered 2023-08-26: 4 mg via INTRAVENOUS

## 2023-08-26 MED ORDER — OXYCODONE HCL 5 MG PO TABS: 5.0000 mg | ORAL_TABLET | Freq: Once | ORAL | Status: DC | PRN

## 2023-08-26 MED ORDER — CEFOTETAN DISODIUM 2 G IJ SOLR
INTRAMUSCULAR | Status: AC
  Filled 2023-08-26: qty 2

## 2023-08-26 MED ORDER — BUPIVACAINE LIPOSOME 1.3 % IJ SUSP: 20.0000 mL | Freq: Once | INTRAMUSCULAR | Status: DC

## 2023-08-26 MED ORDER — DEXAMETHASONE SODIUM PHOSPHATE 10 MG/ML IJ SOLN
INTRAMUSCULAR | Status: DC | PRN
  Administered 2023-08-26: 10 mg via INTRAVENOUS

## 2023-08-26 MED ORDER — ACETAMINOPHEN 500 MG PO TABS
ORAL_TABLET | ORAL | Status: AC
Start: 2023-08-26 — End: ?
  Filled 2023-08-26: qty 2

## 2023-08-26 MED ORDER — FENTANYL CITRATE (PF) 100 MCG/2ML IJ SOLN
INTRAMUSCULAR | Status: DC | PRN
  Administered 2023-08-26: 100 ug via INTRAVENOUS

## 2023-08-26 MED ORDER — KETOROLAC TROMETHAMINE 30 MG/ML IJ SOLN: 30.0000 mg | Freq: Once | INTRAMUSCULAR | Status: DC | PRN

## 2023-08-26 MED ORDER — LACTATED RINGERS IV SOLN: INTRAVENOUS | Status: DC

## 2023-08-26 MED ORDER — ROCURONIUM BROMIDE 10 MG/ML (PF) SYRINGE
PREFILLED_SYRINGE | INTRAVENOUS | Status: AC
Start: 2023-08-26 — End: ?
  Filled 2023-08-26: qty 10

## 2023-08-26 MED ORDER — EPHEDRINE SULFATE (PRESSORS) 50 MG/ML IJ SOLN
INTRAMUSCULAR | Status: DC | PRN
  Administered 2023-08-26: 5 mg via INTRAVENOUS

## 2023-08-26 MED ORDER — SODIUM CHLORIDE 0.9 % IV SOLN
2.0000 g | INTRAVENOUS | Status: AC
  Administered 2023-08-26: 2 g via INTRAVENOUS

## 2023-08-26 MED ORDER — SODIUM CHLORIDE 0.9 % IV SOLN: INTRAVENOUS | Status: DC | PRN

## 2023-08-26 MED ORDER — OXYCODONE HCL 5 MG/5ML PO SOLN: 5.0000 mg | Freq: Once | ORAL | Status: DC | PRN

## 2023-08-26 MED ORDER — STERILE WATER FOR IRRIGATION IR SOLN
Status: DC | PRN
Start: 2023-08-26 — End: 2023-08-26
  Administered 2023-08-26: 500 mL

## 2023-08-26 MED ORDER — KETOROLAC TROMETHAMINE 30 MG/ML IJ SOLN
INTRAMUSCULAR | Status: AC
  Filled 2023-08-26: qty 1

## 2023-08-26 MED ORDER — BUPIVACAINE-EPINEPHRINE (PF) 0.5% -1:200000 IJ SOLN
INTRAMUSCULAR | Status: DC | PRN
  Administered 2023-08-26: 30 mL via SURGICAL_CAVITY

## 2023-08-26 MED ORDER — ONDANSETRON HCL 4 MG/2ML IJ SOLN
INTRAMUSCULAR | Status: AC
  Filled 2023-08-26: qty 2

## 2023-08-26 MED ORDER — ACETAMINOPHEN 500 MG PO TABS
1000.0000 mg | ORAL_TABLET | ORAL | Status: AC
  Administered 2023-08-26: 1000 mg via ORAL

## 2023-08-26 MED ORDER — 0.9 % SODIUM CHLORIDE (POUR BTL) OPTIME
TOPICAL | Status: DC | PRN
  Administered 2023-08-26: 500 mL

## 2023-08-26 MED ORDER — ROCURONIUM BROMIDE 10 MG/ML (PF) SYRINGE
PREFILLED_SYRINGE | INTRAVENOUS | Status: DC | PRN
  Administered 2023-08-26: 50 mg via INTRAVENOUS
  Administered 2023-08-26: 10 mg via INTRAVENOUS

## 2023-08-26 MED ORDER — LIDOCAINE 2% (20 MG/ML) 5 ML SYRINGE
INTRAMUSCULAR | Status: DC | PRN
  Administered 2023-08-26: 100 mg via INTRAVENOUS

## 2023-08-26 MED ORDER — PROPOFOL 10 MG/ML IV BOLUS
INTRAVENOUS | Status: DC | PRN
  Administered 2023-08-26: 170 mg via INTRAVENOUS

## 2023-08-26 MED ORDER — DEXAMETHASONE SODIUM PHOSPHATE 10 MG/ML IJ SOLN
INTRAMUSCULAR | Status: AC
Start: 2023-08-26 — End: ?
  Filled 2023-08-26: qty 1

## 2023-08-26 MED ORDER — HYDROMORPHONE HCL 1 MG/ML IJ SOLN: 0.2500 mg | INTRAMUSCULAR | Status: DC | PRN

## 2023-08-26 SURGICAL SUPPLY — 62 items
APL SKNCLS STERI-STRIP NONHPOA (GAUZE/BANDAGES/DRESSINGS) ×2
BENZOIN TINCTURE PRP APPL 2/3 (GAUZE/BANDAGES/DRESSINGS) ×2 IMPLANT
BLADE EXTENDED COATED 6.5IN (ELECTRODE) IMPLANT
BLADE SURG 10 STRL SS (BLADE) IMPLANT
BLADE SURG 15 STRL LF DISP TIS (BLADE) ×1 IMPLANT
BLADE SURG 15 STRL SS (BLADE) ×1
BRIEF MESH DISP LRG (UNDERPADS AND DIAPERS) ×1 IMPLANT
COVER BACK TABLE 60X90IN (DRAPES) ×1 IMPLANT
COVER MAYO STAND STRL (DRAPES) ×1 IMPLANT
DRAPE LAPAROTOMY 100X72 PEDS (DRAPES) ×1 IMPLANT
DRAPE UTILITY XL STRL (DRAPES) ×1 IMPLANT
ELECT NDL BLADE 2-5/6 (NEEDLE) ×1 IMPLANT
ELECT NEEDLE BLADE 2-5/6 (NEEDLE) ×1
GAUZE 4X4 16PLY ~~LOC~~+RFID DBL (SPONGE) ×1 IMPLANT
GAUZE PAD ABD 8X10 STRL (GAUZE/BANDAGES/DRESSINGS) ×1 IMPLANT
GAUZE SPONGE 4X4 12PLY STRL (GAUZE/BANDAGES/DRESSINGS) ×1 IMPLANT
GAUZE SPONGE 4X4 12PLY STRL LF (GAUZE/BANDAGES/DRESSINGS) IMPLANT
GLOVE BIO SURGEON STRL SZ7.5 (GLOVE) ×1 IMPLANT
GLOVE INDICATOR 8.0 STRL GRN (GLOVE) ×1 IMPLANT
GOWN STRL REUS W/TWL XL LVL3 (GOWN DISPOSABLE) ×1 IMPLANT
HYDROGEN PEROXIDE 16OZ (MISCELLANEOUS) IMPLANT
IV CATH 14GX2 1/4 (CATHETERS) IMPLANT
IV CATH 18G SAFETY (IV SOLUTION) IMPLANT
KIT SIGMOIDOSCOPE (SET/KITS/TRAYS/PACK) IMPLANT
KIT TURNOVER CYSTO (KITS) ×1 IMPLANT
LOOP VASCLR MAXI BLUE 18IN ST (MISCELLANEOUS) IMPLANT
NDL HYPO 22X1.5 SAFETY MO (MISCELLANEOUS) ×1 IMPLANT
NDL SAFETY ECLIPSE 18X1.5 (NEEDLE) IMPLANT
NEEDLE HYPO 22X1.5 SAFETY MO (MISCELLANEOUS) ×1
NS IRRIG 500ML POUR BTL (IV SOLUTION) ×1 IMPLANT
PACK BASIN DAY SURGERY FS (CUSTOM PROCEDURE TRAY) ×1 IMPLANT
PENCIL SMOKE EVACUATOR (MISCELLANEOUS) ×1 IMPLANT
RETRACTOR RING URO 16.6X16.6 (MISCELLANEOUS) ×1 IMPLANT
RETRACTOR STAY HOOK 5MM (MISCELLANEOUS) ×1 IMPLANT
SLEEVE SCD COMPRESS KNEE MED (STOCKING) ×1 IMPLANT
SPIKE FLUID TRANSFER (MISCELLANEOUS) ×1 IMPLANT
SPONGE HEMORRHOID 8X3CM (HEMOSTASIS) IMPLANT
SPONGE SURGIFOAM ABS GEL 12-7 (HEMOSTASIS) IMPLANT
SUCTION TUBE FRAZIER 10FR DISP (SUCTIONS) ×1 IMPLANT
SUT CHROMIC 2 0 SH (SUTURE) IMPLANT
SUT CHROMIC 3 0 SH 27 (SUTURE) IMPLANT
SUT MNCRL AB 4-0 PS2 18 (SUTURE) IMPLANT
SUT SILK 0 TIES 10X30 (SUTURE) IMPLANT
SUT SILK 2 0 (SUTURE)
SUT SILK 2-0 18XBRD TIE 12 (SUTURE) IMPLANT
SUT VIC AB 2-0 SH 27 (SUTURE) ×1
SUT VIC AB 2-0 SH 27XBRD (SUTURE) IMPLANT
SUT VIC AB 2-0 UR6 27 (SUTURE) IMPLANT
SUT VIC AB 3-0 SH 18 (SUTURE) IMPLANT
SUT VIC AB 3-0 SH 27 (SUTURE) ×1
SUT VIC AB 3-0 SH 27X BRD (SUTURE) IMPLANT
SUT VIC AB 3-0 SH 27XBRD (SUTURE) IMPLANT
SUT VICRYL 0 UR6 27IN ABS (SUTURE) IMPLANT
SUT VICRYL 2 0 18 UND BR (SUTURE) IMPLANT
SYR 20ML LL LF (SYRINGE) IMPLANT
SYR BULB IRRIG 60ML STRL (SYRINGE) ×1 IMPLANT
SYR CONTROL 10ML LL (SYRINGE) ×1 IMPLANT
TOWEL OR 17X24 6PK STRL BLUE (TOWEL DISPOSABLE) ×1 IMPLANT
TRAY DSU PREP LF (CUSTOM PROCEDURE TRAY) ×1 IMPLANT
TUBE CONNECTING 12X1/4 (SUCTIONS) ×1 IMPLANT
VASCULAR TIE MAXI BLUE 18IN ST (MISCELLANEOUS)
YANKAUER SUCT BULB TIP NO VENT (SUCTIONS) IMPLANT

## 2023-08-26 NOTE — Op Note (Signed)
08/26/2023  10:41 AM  PATIENT:  Jeremiah Baker  47 y.o. male  Patient Care Team: Darrow Bussing, MD as PCP - General (Family Medicine)  PRE-OPERATIVE DIAGNOSIS:  Transsphincteric anal fistula  POST-OPERATIVE DIAGNOSIS:  Same  PROCEDURE:   Surgical treatment of transsphincteric anal fistula - ligation of intersphincteric fistulous tract (LIFT) Anorectal exam under anesthesia  SURGEON:  Surgeon(s): Andria Meuse, MD  ANESTHESIA:   local and general  SPECIMEN:  No Specimen  DISPOSITION OF SPECIMEN:  N/A  COUNTS:  Sponge, needle, and instrument counts were reported correct x2 at conclusion.  EBL: 5 mL  Drains: None  PLAN OF CARE: Discharge to home after PACU  PATIENT DISPOSITION:  PACU - hemodynamically stable.  OR FINDINGS: Healthy appearing anal canal without any evidence of active inflammation. Right anterior transsphincteric fistula without any evident blind tracts or abscesses/purulence.  Ligation of the intersphincteric fistulous tract was carried out uneventfully.  DESCRIPTION: The patient was identified in the preoperative holding area and taken to the OR. SCDs were applied. He then underwent general endotracheal anesthesia without difficulty. The patient was then rolled onto the OR table in the prone jackknife position. Pressure points were then evaluated and padded. Benzoin was applied to the buttocks and they were gently taped apart.  He was then prepped and draped in usual sterile fashion.  A surgical timeout was performed indicating the correct patient, procedure, and positioning.  A perianal block was then created using a dilute mixture of 0.5% Marcaine with epinephrine and Exparel (30 cc of a mixture of 30cc Marcaine+20cc Exparel)  After ascertaining an appropriate level of anesthesia had been achieved, a well lubricated digital rectal exam was performed. This demonstrated no palpable abnormalities.  A Hill-Ferguson anoscope was into the anal canal and  circumferential inspection demonstrated healthy appearing anoderm.  There is a known blue vessel loop draining seton in position in the right anterior position.  Both the internal/external openings are healthy in appearance.  There is no surrounding erythema, fluctuance, or purulent drainage.  The seton is exchanged for a semirigid fistula probe.  The tract is palpated and reclassified.  It remains a transsphincteric type fistula.  Internal opening is at the dentate line.  The intersphincteric groove is palpated.  A curvilinear type incision is created overlying the intersphincteric groove.  The subcutaneous tissues divided electrocautery.  A [place] retractor was then placed.  Were then able to carefully develop this plane within the intersphincteric groove without dividing any of the internal or external sphincter muscle.  This plane is maintained down to where his evident fistula tract is.  The tract is then carefully circumferentially dissected bluntly.  We are easily able to control this tract.  There are no apparent blind-ending extensions off of this at this time.  The fistula probe was then removed and the tract is doubly clamped.  The fistula is divided between clamps.  Each respective end of the fistula is then suture-ligated using 3-0 Vicryl sutures in a figure-of-eight fashion, 3 on each side.  The external opening is then carefully probed and we have successfully excluded the tract.  A crypt hook is then used to carefully probed the internal opening and we have also confirmed closure of this tract without any evident defects.  The wound is irrigated.  Hemostasis is achieved.  The wound is then closed in layers using 3-0 Vicryl sutures to reapproximate the intersphincteric plane.  All sponge, needle, and instrument counts were reported correct.  The skin is  then closed using 3-0 chromic sutures.  The wound was washed and dried.  A dressing consisting of 4 x 4's, ABD, mesh underwear was placed.  He was  subsequently rolled back onto a stretcher, awakened from anesthesia, extubated, transported to the recovery room in satisfactory condition.  DISPOSITION: PACU in satisfactory condition.

## 2023-08-26 NOTE — Anesthesia Procedure Notes (Signed)
Procedure Name: Intubation Date/Time: 08/26/2023 9:44 AM  Performed by: Briant Sites, CRNAPre-anesthesia Checklist: Patient identified, Emergency Drugs available, Suction available and Patient being monitored Patient Re-evaluated:Patient Re-evaluated prior to induction Oxygen Delivery Method: Circle system utilized Preoxygenation: Pre-oxygenation with 100% oxygen Induction Type: IV induction Ventilation: Mask ventilation without difficulty Laryngoscope Size: Mac and 4 Grade View: Grade I Tube type: Oral Tube size: 7.5 mm Number of attempts: 1 Airway Equipment and Method: Stylet and Oral airway Placement Confirmation: ETT inserted through vocal cords under direct vision, positive ETCO2 and breath sounds checked- equal and bilateral Secured at: 21 cm Tube secured with: Tape Dental Injury: Teeth and Oropharynx as per pre-operative assessment

## 2023-08-26 NOTE — H&P (Signed)
CC: Here today for surgery  HPI: Jeremiah Baker is an 47 y.o. male seen in the office today as a referral by Dr. Marina Goodell for evaluation of possible anal fistula.  Colonoscopy with Dr. Marina Goodell 01/25/23 -  Examined portion of ileum normal 4 mm polyp in the ascending colon, removed. Internal hemorrhoids Photos show punctate opening suspicious in appearance for external opening of anal fistula  Reports an approximate 1 year history of perirectal discomfort where he had some mucopurulent material draining a year ago. Occasionally blood-tinged. He has tried a myriad of topical ointments and creams. He was seen with gastroenterology, Dr. Marina Goodell, who ultimately performed the above mentioned colonoscopy. Referred to see [place] for possible fistula.  Currently, it is more in swollen state. Scant if any drainage. No significant pain per se. It will swell and decompressed well and decompressed in a somewhat cyclical manner.  OR 05/26/23 Surgical treatment of anal fistula transsphincteric with placement of seton Anorectal exam under anesthesia  OR FINDINGS: Right anterior transsphincteric anal fistula, ultimately controlled with blue vessel loop draining seton.   He denies any changes in health or health history since we met in the office. No new medications/allergies. He states he is ready for surgery today. He denies any new perianal abscesses or issues since his last procedure.  PMH: denies  PSH: Cholecystectomy, laparoscopic-06/2015  FHx: Denies any known family history of colorectal, breast, endometrial or ovarian cancer  Social Hx: Denies use of tobacco/EtOH/illicit drug. He works as a Clinical cytogeneticist. He is here today by himself. He is originally from [place]. He speaks both [place] and [place]. Also notes Jewish ancestry.   Past Medical History:  Diagnosis Date   Anal fistula    History of kidney stones    per pt passed stones approx 1990s    Past  Surgical History:  Procedure Laterality Date   ANAL FISTULOTOMY N/A 05/26/2023   Procedure: SETON PLACEMENT;  Surgeon: Andria Meuse, MD;  Location: [facility];  Service: General;  Laterality: N/A;   COLONOSCOPY WITH PROPOFOL  01/25/2023   dr Marina Goodell   LAPAROSCOPIC CHOLECYSTECTOMY SINGLE SITE WITH INTRAOPERATIVE CHOLANGIOGRAM N/A 07/25/2015   Procedure: LAPAROSCOPIC CHOLECYSTECTOMY SINGLE SITE WITH INTRAOPERATIVE CHOLANGIOGRAM;  Surgeon: Karie Soda, MD;  Location: [facility];  Service: General;  Laterality: N/A;   RECTAL EXAM UNDER ANESTHESIA N/A 05/26/2023   Procedure: ANORECTAL EXAM UNDER ANESTHESIA;  Surgeon: Andria Meuse, MD;  Location: [facility];  Service: General;  Laterality: N/A;   WISDOM TOOTH EXTRACTION      Family History  Problem Relation Age of Onset   Non-Hodgkin's lymphoma Father    Hypertension Mother    Colon polyps Mother    Colon cancer Neg Hx    Esophageal cancer Neg Hx    Stomach cancer Neg Hx    Rectal cancer Neg Hx     Social:  reports that he has never smoked. He has never used smokeless tobacco. He reports that he does not drink alcohol and does not use drugs.  Allergies:  Allergies  Allergen Reactions   Other Hives    Mahi-mahi    Shrimp (Diagnostic) Hives   Gadobutrol Hives    Pt displayed hives in neck and chest post contrast.     Medications: I have reviewed the patient's current medications.  No results found for this or any previous visit (from the past 48 hour(s)).  No results found.   PE Height 5\' 7"  (1.702 m), weight 70.8  kg. Constitutional: NAD; conversant Eyes: Moist conjunctiva; no lid lag; anicteric Lungs: Normal respiratory effort CV: RRR Psychiatric: Appropriate affect  No results found for this or any previous visit (from the past 48 hour(s)).  No results found.  A/P: Jeremiah Baker is an 47 y.o. male with no reported medical hx here for surgery to address his transsphincteric anal fistula.  No evidence of  intraluminal Crohn's disease on colonoscopy 01/2023   -The anatomy and physiology of the anal canal was discussed with the patient. The pathophysiology of anal fistula was discussed as well, working to provide a good understanding. -We have reviewed options going forward including further observation with indwelling seton vs surgery - ligation of intersphincteric fistulous tract (LIFT), anorectal exam under anesthesia - scenarios where a fistulotomy may alternatively be pursued -The planned procedure, material risks (including, but not limited to, pain, bleeding, infection, scarring, need for blood transfusion, damage to anal sphincter, incontinence of gas and/or stool, need for additional procedures, recurrence, pneumonia, heart attack, stroke, death) benefits and alternatives to surgery were discussed at length. I noted a good probability that the procedure would help improve their symptoms. The patient's questions were answered to his satisfaction, he voiced understanding and elected to proceed with surgery. Additionally, we discussed typical postoperative expectations and the recovery process.  Marin Olp, MD [facility]

## 2023-08-26 NOTE — Progress Notes (Signed)
Patient informs RN several times that he is ready to go home, "I will take care of this at home." Pt continues to be unable to urinate. MD called and reports that patient can go home but to chart that patient wants to go home and has been advised to stay.

## 2023-08-26 NOTE — Transfer of Care (Signed)
Immediate Anesthesia Transfer of Care Note  Patient: Jeremiah Baker  Procedure(s) Performed: ANAL FISTULA TRANSSPHINCTERIC LIGATION OF INTERNAL FISTULA TRACT ANORECTAL EXAM UNDER ANESTHESIA  Patient Location: PACU  Anesthesia Type:General  Level of Consciousness: sedated  Airway & Oxygen Therapy: Patient Spontanous Breathing and Patient connected to nasal cannula oxygen  Post-op Assessment: Report given to RN  Post vital signs: Reviewed and stable  Last Vitals:  Vitals Value Taken Time  BP 120/77 08/26/23 1052  Temp 36.9 C 08/26/23 1052  Pulse 90 08/26/23 1053  Resp 18 08/26/23 1053  SpO2 98 % 08/26/23 1053  Vitals shown include unfiled device data.  Last Pain:  Vitals:   08/26/23 0744  TempSrc: Oral  PainSc: 0-No pain      Patients Stated Pain Goal: 6 (08/26/23 0744)  Complications: No notable events documented.

## 2023-08-26 NOTE — Discharge Instructions (Addendum)
ANORECTAL SURGERY: POST OP INSTRUCTIONS  DIET: Follow a light bland diet the first 24 hours after arrival home, such as soup, liquids, crackers, etc.  Be sure to include lots of fluids daily.  Avoid fast food or heavy meals as your are more likely to get nauseated.  Eat a low fat diet the next few days after surgery.   Some bleeding with bowel movements is expected for the first couple of days but this should stop in between bowel movements  Take your usually prescribed home medications unless otherwise directed. No foreign bodies per rectum for the next 3 months (enemas, etc)  PAIN CONTROL: It is helpful to take an over-the-counter pain medication regularly for the first few days/weeks.  Choose from the following that works best for you: Ibuprofen (Advil, etc) Three 200mg tabs every 6 hours as needed. Acetaminophen (Tylenol, etc) 500-650mg every 6 hours as needed NOTE: You may take both of these medications together - most patients find it most helpful when alternating between the two (i.e. Ibuprofen at 6am, tylenol at 9am, ibuprofen at 12pm ...) A  prescription for pain medication may have been prescribed for you at discharge.  Take your pain medication as prescribed.  If you are having problems/concerns with the prescription medicine, please call us for further advice.  Avoid getting constipated.  Between the surgery and the pain medications, it is common to experience some constipation.  Increasing fluid intake (64oz of water per day) and taking a fiber supplement (such as Metamucil, Citrucel, FiberCon) 1-2 times a day regularly will usually help prevent this problem from occurring.  Take Miralax (over the counter) 1-2x/day while taking a narcotic pain medication. If no bowel movement after 48hours, you may additionally take a laxative like a bottle of Milk of Magnesia which can be purchased over the counter. Avoid enemas.   Watch out for diarrhea.  If you have many loose bowel movements,  simplify your diet to bland foods.  Stop any stool softeners and decrease your fiber supplement. If this worsens or does not improve, please call us.  Wash / shower every day.  If you were discharged with a dressing, you may remove this the day after your surgery. You may shower normally, getting soap/water on your wound, particularly after bowel movements.  Soaking in a warm bath filled a couple inches ("Sitz bath") is a great way to clean the area after a bowel movement and many patients find it is a way to soothe the area.  ACTIVITIES as tolerated:   You may resume regular (light) daily activities beginning the next day--such as daily self-care, walking, climbing stairs--gradually increasing activities as tolerated.  If you can walk 30 minutes without difficulty, it is safe to try more intense activity such as jogging, treadmill, bicycling, low-impact aerobics, etc. Refrain from any heavy lifting or straining for the first 2 weeks after your procedure, particularly if your surgery was for hemorrhoids. Avoid activities that make your pain worse You may drive when you are no longer taking prescription pain medication, you can comfortably wear a seatbelt, and you can safely maneuver your car and apply brakes.  FOLLOW UP in our office Please call [facility] at [phone] to set up an appointment to see your surgeon in the office for a follow-up appointment approximately 2 weeks after your surgery. Make sure that you call for this appointment the day you arrive home to insure a convenient appointment time.  9. If you have disability or family leave forms   that need to be completed, you may have them completed by your primary care physician's office; for return to work instructions, please ask our office staff and they will be happy to assist you in obtaining this documentation   When to call us [phone]: Poor pain control Reactions / problems with new medications (rash/itching, etc)  Fever over  101.5 F (38.5 C) Inability to urinate Nausea/vomiting Worsening swelling or bruising Continued bleeding from incision. Increased pain, redness, or drainage from the incision  The clinic staff is available to answer your questions during regular business hours (8:30am-5pm).  Please don't hesitate to call and ask to speak to one of our nurses for clinical concerns.   A surgeon from [facility] is always on call at the hospitals   If you have a medical emergency, go to the nearest emergency room or call 911.   [facility] [address] [place], [place], [place]  [address] MAIN: [phone] FAX: [phone] [email or web address]  Post Anesthesia Home Care Instructions  Activity: Get plenty of rest for the remainder of the day. A responsible adult should stay with you for 24 hours following the procedure.  For the next 24 hours, DO NOT: -Drive a car -Paediatric nurse -Drink alcoholic beverages -Take any medication unless instructed by your physician -Make any legal decisions or sign important papers.  Meals: Start with liquid foods such as gelatin or soup. Progress to regular foods as tolerated. Avoid greasy, spicy, heavy foods. If nausea and/or vomiting occur, drink only clear liquids until the nausea and/or vomiting subsides. Call your physician if vomiting continues.  Special Instructions/Symptoms: Your throat may feel dry or sore from the anesthesia or the breathing tube placed in your throat during surgery. If this causes discomfort, gargle with warm salt water. The discomfort should disappear within 24 hours.    Information for Discharge Teaching: EXPAREL (bupivacaine liposome injectable suspension)   Your surgeon gave you EXPAREL(bupivacaine) in your surgical incision to help control your pain after surgery.  EXPAREL is a local anesthetic that provides pain relief by numbing the tissue around the  surgical site. EXPAREL is designed to release pain medication over time and can control pain for up to 72 hours. Depending on how you respond to EXPAREL, you may require less pain medication during your recovery.  Possible side effects: Temporary loss of sensation or ability to move in the area where bupivacaine was injected. Nausea, vomiting, constipation Rarely, numbness and tingling in your mouth or lips, lightheadedness, or anxiety may occur. Call your doctor right away if you think you may be experiencing any of these sensations, or if you have other questions regarding possible side effects.  Follow all other discharge instructions given to you by your surgeon or nurse. Eat a healthy diet and drink plenty of water or other fluids.  If you return to the hospital for any reason within 96 hours following the administration of EXPAREL, please inform your health care providers.

## 2023-08-26 NOTE — Discharge Instr - Supplementary Instructions (Signed)
May take Tylenol after 1:50pm if needed for discomfort. May take Ibuprofen after 4:30pm if needed for discomfort.

## 2023-08-26 NOTE — Anesthesia Postprocedure Evaluation (Signed)
Anesthesia Post Note  Patient: Jeremiah Baker  Procedure(s) Performed: ANAL FISTULA TRANSSPHINCTERIC LIGATION OF INTERNAL FISTULA TRACT ANORECTAL EXAM UNDER ANESTHESIA     Patient location during evaluation: PACU Anesthesia Type: General Level of consciousness: awake and alert Pain management: pain level controlled Vital Signs Assessment: post-procedure vital signs reviewed and stable Respiratory status: spontaneous breathing, nonlabored ventilation, respiratory function stable and patient connected to nasal cannula oxygen Cardiovascular status: blood pressure returned to baseline and stable Postop Assessment: no apparent nausea or vomiting Anesthetic complications: no   No notable events documented.  Last Vitals:  Vitals:   08/26/23 1123 08/26/23 1205  BP:  117/83  Pulse: 84 77  Resp: 18 20  Temp: 37 C   SpO2: 95% 98%    Last Pain:  Vitals:   08/26/23 1205  TempSrc:   PainSc: 0-No pain                 Trevor Iha

## 2023-08-30 ENCOUNTER — Encounter (HOSPITAL_BASED_OUTPATIENT_CLINIC_OR_DEPARTMENT_OTHER): Payer: Self-pay | Admitting: Surgery
